# Patient Record
Sex: Female | Born: 1955 | Race: White | Hispanic: No | Marital: Married | Smoking: Former smoker
Health system: Southern US, Community
[De-identification: ages and names within clinical notes are randomized; demographics above are authoritative.]

## PROBLEM LIST (undated history)

## (undated) DIAGNOSIS — T4145XA Adverse effect of unspecified anesthetic, initial encounter: Secondary | ICD-10-CM

## (undated) DIAGNOSIS — H9190 Unspecified hearing loss, unspecified ear: Secondary | ICD-10-CM

## (undated) DIAGNOSIS — I10 Essential (primary) hypertension: Secondary | ICD-10-CM

## (undated) DIAGNOSIS — E669 Obesity, unspecified: Secondary | ICD-10-CM

## (undated) DIAGNOSIS — Z9889 Other specified postprocedural states: Secondary | ICD-10-CM

## (undated) DIAGNOSIS — M7918 Myalgia, other site: Secondary | ICD-10-CM

## (undated) DIAGNOSIS — Z8719 Personal history of other diseases of the digestive system: Secondary | ICD-10-CM

## (undated) DIAGNOSIS — E7800 Pure hypercholesterolemia, unspecified: Secondary | ICD-10-CM

## (undated) DIAGNOSIS — T8859XA Other complications of anesthesia, initial encounter: Secondary | ICD-10-CM

## (undated) DIAGNOSIS — R251 Tremor, unspecified: Secondary | ICD-10-CM

## (undated) DIAGNOSIS — K635 Polyp of colon: Secondary | ICD-10-CM

## (undated) DIAGNOSIS — K219 Gastro-esophageal reflux disease without esophagitis: Secondary | ICD-10-CM

## (undated) DIAGNOSIS — M26629 Arthralgia of temporomandibular joint, unspecified side: Secondary | ICD-10-CM

## (undated) DIAGNOSIS — R112 Nausea with vomiting, unspecified: Secondary | ICD-10-CM

## (undated) DIAGNOSIS — J189 Pneumonia, unspecified organism: Secondary | ICD-10-CM

## (undated) HISTORY — DX: Pure hypercholesterolemia, unspecified: E78.00

## (undated) HISTORY — PX: VAGINAL HYSTERECTOMY: SUR661

## (undated) HISTORY — PX: TONSILLECTOMY: SUR1361

## (undated) HISTORY — PX: TEMPOROMANDIBULAR JOINT SURGERY: SHX35

## (undated) HISTORY — DX: Other specified postprocedural states: Z98.890

## (undated) HISTORY — DX: Unspecified hearing loss, unspecified ear: H91.90

## (undated) HISTORY — DX: Polyp of colon: K63.5

## (undated) HISTORY — DX: Arthralgia of temporomandibular joint, unspecified side: M26.629

## (undated) HISTORY — PX: CHOLECYSTECTOMY: SHX55

## (undated) HISTORY — DX: Tremor, unspecified: R25.1

## (undated) HISTORY — DX: Obesity, unspecified: E66.9

## (undated) HISTORY — PX: BREAST SURGERY: SHX581

## (undated) HISTORY — DX: Essential (primary) hypertension: I10

## (undated) HISTORY — PX: VAGINAL PROLAPSE REPAIR: SHX830

## (undated) HISTORY — DX: Myalgia, other site: M79.18

---

## 2002-07-30 ENCOUNTER — Ambulatory Visit (HOSPITAL_COMMUNITY): Admission: RE | Admit: 2002-07-30 | Discharge: 2002-07-30 | Payer: Self-pay | Admitting: Orthopaedic Surgery

## 2002-07-30 ENCOUNTER — Encounter: Payer: Self-pay | Admitting: Orthopaedic Surgery

## 2003-10-01 DIAGNOSIS — Z9889 Other specified postprocedural states: Secondary | ICD-10-CM

## 2003-10-01 HISTORY — DX: Other specified postprocedural states: Z98.890

## 2006-10-02 ENCOUNTER — Ambulatory Visit: Payer: Self-pay | Admitting: General Practice

## 2009-06-07 ENCOUNTER — Ambulatory Visit (HOSPITAL_COMMUNITY): Admission: RE | Admit: 2009-06-07 | Discharge: 2009-06-07 | Payer: Self-pay | Admitting: Family Medicine

## 2010-01-12 ENCOUNTER — Ambulatory Visit (HOSPITAL_COMMUNITY)
Admission: RE | Admit: 2010-01-12 | Discharge: 2010-01-12 | Payer: Self-pay | Source: Home / Self Care | Admitting: Family Medicine

## 2011-02-15 LAB — COMPREHENSIVE METABOLIC PANEL
AST: 17 U/L
BUN/Creatinine Ratio: 15
Calcium: 9.8 mg/dL
Chloride, Serum: 105
Globulin, Total: 2.7
Total Protein: 6.9 g/dL

## 2011-02-15 LAB — CBC WITH DIFFERENTIAL/PLATELET
HCT: 41 %
platelet count: 354

## 2011-02-18 ENCOUNTER — Other Ambulatory Visit (HOSPITAL_COMMUNITY): Payer: Self-pay | Admitting: Internal Medicine

## 2011-02-18 DIAGNOSIS — N644 Mastodynia: Secondary | ICD-10-CM

## 2011-02-28 ENCOUNTER — Encounter: Payer: Self-pay | Admitting: Urgent Care

## 2011-02-28 ENCOUNTER — Ambulatory Visit (INDEPENDENT_AMBULATORY_CARE_PROVIDER_SITE_OTHER): Payer: 59 | Admitting: Urgent Care

## 2011-02-28 DIAGNOSIS — D126 Benign neoplasm of colon, unspecified: Secondary | ICD-10-CM

## 2011-02-28 DIAGNOSIS — Z800 Family history of malignant neoplasm of digestive organs: Secondary | ICD-10-CM

## 2011-02-28 DIAGNOSIS — K635 Polyp of colon: Secondary | ICD-10-CM

## 2011-02-28 DIAGNOSIS — R195 Other fecal abnormalities: Secondary | ICD-10-CM

## 2011-02-28 DIAGNOSIS — K219 Gastro-esophageal reflux disease without esophagitis: Secondary | ICD-10-CM

## 2011-02-28 MED ORDER — OMEPRAZOLE 20 MG PO CPDR: 20.0000 mg | DELAYED_RELEASE_CAPSULE | Freq: Every day | ORAL | Status: DC

## 2011-02-28 MED ORDER — SOD PICOSULFATE-MAG OX-CIT ACD 10-3.5-12 MG-GM-GM PO PACK: 1.0000 | PACK | Freq: Once | ORAL | Status: DC

## 2011-02-28 NOTE — Assessment & Plan Note (Signed)
Begin daily omeprazole 20 mg minutes before breakfast EGD

## 2011-02-28 NOTE — Assessment & Plan Note (Signed)
Brenda Love is a pleasant 55 y.o. female was recently told she had Hemoccult positive stool after her returning a card for her physical exam. She does have a personal history of colon polyps and a family history colon cancer in her mother.  Her symptoms include constipation and GERD, poorly controlled.  She is going to need both colonoscopy and EGD for further evaluation given her symptoms.  I have discussed risks & benefits which include, but are not limited to, bleeding, infection, perforation & drug reaction.  The patient agrees with this plan & written consent will be obtained.

## 2011-02-28 NOTE — Patient Instructions (Signed)
You need a colonoscopy and EGD with Dr. Darrick Penna Begin omeprazole 20 mg daily 30 minutes before breakfast.

## 2011-02-28 NOTE — Progress Notes (Signed)
Primary Care Physician:  Ignatius Specking., MD, MD Primary Gastroenterologist:  Dr. Darrick Penna  Chief Complaint  Patient presents with  . Colonoscopy    heme + stool, constipation    HPI:  Brenda Love is a 55 y.o. female here as a new patient for evaluation of heme positive stool on card for complete physical exam.  She also has a family history of colon cancer and a personal history of colon polyps. She complains of daily GERD taking OTC acid reducer she thinks it is generic Prilosec.  This medicine seems to help with her daily heartburn & indigestion.   Denies nausea, vomiting, dysphagia, odynophagia or anorexia.  C/o constipation.  Small hard balls.  Taking ketoprofen-she thinks this makes her constipation worse.  Taking daily over-the-counter stool softeners. Denies melena.  BM QD or QOD.  Wt stable.  App ok.    Past Medical History  Diagnosis Date  . Obesity   . Colon polyps     Dr. Linna Darner  . S/P colonoscopy 2007    Dr Hal Morales  . Hypertension   . Hypercholesteremia   . Hard of hearing   . TMJ syndrome     Past Surgical History  Procedure Date  . Vaginal hysterectomy   . Vaginal prolapse repair   . Cholecystectomy   . Tonsillectomy     Current Outpatient Prescriptions  Medication Sig Dispense Refill  . cyclobenzaprine (FLEXERIL) 10 MG tablet Take by mouth as needed.      Tery Sanfilippo Calcium (STOOL SOFTENER PO) Take 1 capsule by mouth daily.        Marland Kitchen ketoprofen (ORUDIS) 75 MG capsule Take 75 mg by mouth.      . metoprolol (LOPRESSOR) 50 MG tablet Take 50 mg by mouth.      Marland Kitchen omeprazole (PRILOSEC) 20 MG capsule Take 20 mg by mouth daily.        . Sod Picosulfate-Mag Ox-Cit Acd (PREPOPIK) 10-3.5-12 MG-GM-GM PACK Take 1 kit by mouth once.  1 each  0    Allergies as of 02/28/2011 - Review Complete 02/28/2011  Allergen Reaction Noted  . Codeine Itching and Nausea And Vomiting 02/28/2011    Family History  Problem Relation Age of Onset  . Colon cancer Mother 77  . Diabetes  Sister   . Coronary artery disease Father     History   Social History  . Marital Status: Married    Spouse Name: N/A    Number of Children: 3  . Years of Education: N/A   Occupational History  . [employer]    Social History Main Topics  . Smoking status: Former Smoker -- 1.0 packs/day for 5 years    Types: Cigarettes    Quit date: 02/27/1994  . Smokeless tobacco: Not on file  . Alcohol Use: No  . Drug Use: No  . Sexually Active: Not on file   Other Topics Concern  . Not on file   Social History Narrative   Lost 1 son-bad accidentOldest son in upper level apt  Review of Systems: Gen: Denies any fever, chills, sweats, anorexia, fatigue, weakness, malaise, weight loss, and sleep disorder CV: Denies chest pain, angina, palpitations, syncope, orthopnea, PND, peripheral edema, and claudication. Resp: Denies dyspnea at rest, dyspnea with exercise, cough, sputum, wheezing, coughing up blood, and pleurisy. GI: Denies vomiting blood, jaundice, and fecal incontinence.    GU : Denies urinary burning, blood in urine, urinary frequency, urinary hesitancy, nocturnal urination, and urinary incontinence. MS: Denies joint pain,  limitation of movement, and swelling, stiffness, low back pain, extremity pain. Denies muscle weakness, cramps, atrophy.  Derm: Denies rash, itching, dry skin, hives, moles, warts, or unhealing ulcers.  Psych: Denies depression, anxiety, memory loss, suicidal ideation, hallucinations, paranoia, and confusion. Heme: Denies bruising, bleeding, and enlarged lymph nodes. Neuro:  Denies any headaches, dizziness, paresthesias. Endo:  Denies any problems with DM, thyroid, adrenal function.  Physical Exam: BP 128/80  Pulse 71  Temp(Src) 97.7 F (36.5 C) (Temporal)  Ht 5' 2.5" (1.588 m)  Wt 223 lb (101.152 kg)  BMI 40.14 kg/m2 General:   Alert,  Well-developed, well-nourished, pleasant and cooperative in NAD.  Accompanied by her daughter today. Head:  Normocephalic  and atraumatic. Eyes:  Sclera clear, no icterus.   Conjunctiva pink. Ears:  Hard of hearing. Wears a hearing aid. Nose:  No deformity, discharge, or lesions. Mouth:  No deformity or lesions,oropharynx pink & moist. Neck:  Supple; no masses or thyromegaly. Lungs:  Clear throughout to auscultation.   No wheezes, crackles, or rhonchi. No acute distress. Heart:  Regular rate and rhythm; no murmurs, clicks, rubs,  or gallops. Abdomen:  Normal bowel sounds.  No bruits.  Soft, non-tender and non-distended without masses, hepatosplenomegaly or hernias noted.  No guarding or rebound tenderness.   Rectal:  Deferred. Msk:  Symmetrical without gross deformities.  Pulses:  Normal pulses noted. Extremities:  No clubbing or edema. Neurologic:  Alert and  oriented x4;  grossly normal neurologically. Skin:  Intact without significant lesions or rashes. Lymph Nodes:  No significant cervical adenopathy. Psych:  Alert and cooperative. Normal mood and affect.

## 2011-02-28 NOTE — Progress Notes (Signed)
Cc to PCP 

## 2011-03-01 ENCOUNTER — Encounter: Payer: Self-pay | Admitting: Urgent Care

## 2011-03-08 ENCOUNTER — Encounter (HOSPITAL_COMMUNITY): Payer: Self-pay | Admitting: Pharmacy Technician

## 2011-03-11 MED ORDER — SODIUM CHLORIDE 0.45 % IV SOLN
Freq: Once | INTRAVENOUS | Status: AC
  Administered 2011-03-13: 1000 mL via INTRAVENOUS

## 2011-03-13 ENCOUNTER — Encounter (HOSPITAL_COMMUNITY): Payer: Self-pay | Admitting: *Deleted

## 2011-03-13 ENCOUNTER — Other Ambulatory Visit: Payer: Self-pay | Admitting: Gastroenterology

## 2011-03-13 ENCOUNTER — Encounter (HOSPITAL_COMMUNITY)
Admission: RE | Disposition: A | Discharge status: Home / Self Care | Payer: Self-pay | Source: Ambulatory Visit | Attending: Gastroenterology

## 2011-03-13 ENCOUNTER — Ambulatory Visit (HOSPITAL_COMMUNITY)
Admission: RE | Admit: 2011-03-13 | Discharge: 2011-03-13 | Disposition: A | Discharge status: Home / Self Care | Payer: 59 | Source: Ambulatory Visit | Attending: Gastroenterology | Admitting: Gastroenterology

## 2011-03-13 DIAGNOSIS — K635 Polyp of colon: Secondary | ICD-10-CM

## 2011-03-13 DIAGNOSIS — I10 Essential (primary) hypertension: Secondary | ICD-10-CM | POA: Insufficient documentation

## 2011-03-13 DIAGNOSIS — Z860100 Personal history of colon polyps, unspecified: Secondary | ICD-10-CM | POA: Insufficient documentation

## 2011-03-13 DIAGNOSIS — K5730 Diverticulosis of large intestine without perforation or abscess without bleeding: Secondary | ICD-10-CM

## 2011-03-13 DIAGNOSIS — R195 Other fecal abnormalities: Secondary | ICD-10-CM

## 2011-03-13 DIAGNOSIS — D126 Benign neoplasm of colon, unspecified: Secondary | ICD-10-CM | POA: Insufficient documentation

## 2011-03-13 DIAGNOSIS — K219 Gastro-esophageal reflux disease without esophagitis: Secondary | ICD-10-CM

## 2011-03-13 DIAGNOSIS — K921 Melena: Secondary | ICD-10-CM | POA: Insufficient documentation

## 2011-03-13 DIAGNOSIS — Z6841 Body Mass Index (BMI) 40.0 and over, adult: Secondary | ICD-10-CM | POA: Insufficient documentation

## 2011-03-13 DIAGNOSIS — E785 Hyperlipidemia, unspecified: Secondary | ICD-10-CM | POA: Insufficient documentation

## 2011-03-13 DIAGNOSIS — K6389 Other specified diseases of intestine: Secondary | ICD-10-CM

## 2011-03-13 DIAGNOSIS — Z800 Family history of malignant neoplasm of digestive organs: Secondary | ICD-10-CM

## 2011-03-13 DIAGNOSIS — K2940 Chronic atrophic gastritis without bleeding: Secondary | ICD-10-CM | POA: Insufficient documentation

## 2011-03-13 DIAGNOSIS — K2960 Other gastritis without bleeding: Secondary | ICD-10-CM

## 2011-03-13 DIAGNOSIS — K319 Disease of stomach and duodenum, unspecified: Secondary | ICD-10-CM | POA: Insufficient documentation

## 2011-03-13 DIAGNOSIS — K648 Other hemorrhoids: Secondary | ICD-10-CM | POA: Insufficient documentation

## 2011-03-13 DIAGNOSIS — K5289 Other specified noninfective gastroenteritis and colitis: Secondary | ICD-10-CM | POA: Insufficient documentation

## 2011-03-13 HISTORY — PX: OTHER SURGICAL HISTORY: SHX169

## 2011-03-13 HISTORY — PX: ESOPHAGOGASTRODUODENOSCOPY: SHX1529

## 2011-03-13 SURGERY — COLONOSCOPY WITH ESOPHAGOGASTRODUODENOSCOPY (EGD)
Anesthesia: Moderate Sedation

## 2011-03-13 MED ORDER — STERILE WATER FOR IRRIGATION IR SOLN
Status: DC | PRN
  Administered 2011-03-13: 15:00:00

## 2011-03-13 MED ORDER — MIDAZOLAM HCL 5 MG/5ML IJ SOLN
INTRAMUSCULAR | Status: DC | PRN
  Administered 2011-03-13 (×5): 1 mg via INTRAVENOUS
  Administered 2011-03-13: 2 mg via INTRAVENOUS

## 2011-03-13 MED ORDER — MIDAZOLAM HCL 5 MG/5ML IJ SOLN
INTRAMUSCULAR | Status: AC
  Filled 2011-03-13: qty 10

## 2011-03-13 MED ORDER — PROMETHAZINE HCL 25 MG/ML IJ SOLN
INTRAMUSCULAR | Status: AC
  Filled 2011-03-13: qty 1

## 2011-03-13 MED ORDER — BUTAMBEN-TETRACAINE-BENZOCAINE 2-2-14 % EX AERO
INHALATION_SPRAY | CUTANEOUS | Status: DC | PRN
  Administered 2011-03-13: 1 via TOPICAL

## 2011-03-13 MED ORDER — MEPERIDINE HCL 100 MG/ML IJ SOLN
INTRAMUSCULAR | Status: DC | PRN
  Administered 2011-03-13 (×2): 25 mg via INTRAVENOUS
  Administered 2011-03-13: 50 mg via INTRAVENOUS

## 2011-03-13 MED ORDER — MEPERIDINE HCL 100 MG/ML IJ SOLN
INTRAMUSCULAR | Status: AC
  Filled 2011-03-13: qty 2

## 2011-03-13 NOTE — Op Note (Signed)
[facility] [address] [place], [place]  [address]  COLONOSCOPY PROCEDURE REPORT  PATIENT:  Brenda Love, Brenda Love  MR#:  [id number] BIRTHDATE:  1955/11/23, 56 yrs. old  GENDER:  female  ENDOSCOPIST:  Jonette Eva, MD REF. BY:  Doreen Beam, M.D. ASSISTANT:  PROCEDURE DATE:  03/13/2011 PROCEDURE:  ILEOColonoscopy with biopsy and snare polypectomy  INDICATIONS:  heme pos stools, personal Hx: polyps, mother had colon ca age 58  MEDICATIONS:   Demerol 100 mg IV, Versed 5 mg IV  DESCRIPTION OF PROCEDURE:    Physical exam was performed. Informed consent was obtained from the patient after explaining the benefits, risks, and alternatives to procedure.  The patient was connected to monitor and placed in left lateral position. Continuous oxygen was provided by nasal cannula and IV medicine administered through an indwelling cannula.  After administration of sedation and rectal exam, the patient's rectum was intubated and the [id number] ([id number]) colonoscope was advanced under direct visualization to the ILEUM.  The scope was removed slowly by carefully examining the color, texture, anatomy, and integrity mucosa on the way out.  The patient was recovered in endoscopy and discharged home in satisfactory condition. <<PROCEDUREIMAGES>>  FINDINGS:  RARE Diverticula were found in the sigmoid to descending colon segments.  MODERATE Internal Hemorrhoids were found. POLYPOID LESION REMOVED FROM HEPATIC FLEXURE AND SIGMOID COLON VIA COLD FORCEPS. LESION ON THE IC VALVE WHICH LOOKED LIKE PRIOR ULCER. IT WAS BIOPSIED VIA COLD FORCEPS.  PREP QUALITY: GOOD CECAL W/D TIME:    23 minutes  COMPLICATIONS:    None  ENDOSCOPIC IMPRESSION: 1) MILD Diverticulosis in the sigmoid to descending colon segments 2) Internal hemorrhoids 3) POLYPOID LESIONS IN COLON 4)IC VALVE LESION MOST LIKELY A HEALING ULCER FROM NSAID INJURY 5)HEME POS STOOLS 2o TO HEMORRHOIDS, NSAID  COLOPATHY/GASTRITIS  RECOMMENDATIONS: TCS IN 5 YEARS AWAIT BIOPSIES MINIMIZE NSAID USE HIGH FIBER DIET  REPEAT EXAM:  No  ______________________________ Jonette Eva, MD  CC:  Doreen Beam, M.D.  n. eSIGNED:   Armentha Branagan at 03/13/2011 03:14 PM  Genene Churn, [phone]

## 2011-03-13 NOTE — Op Note (Signed)
[facility] [address] [place], [place]  [address]  ENDOSCOPY PROCEDURE REPORT  PATIENT:  Love, Brenda  MR#:  [id number] BIRTHDATE:  May 23, 1955, 55 yrs. old  GENDER:  female  ENDOSCOPIST:  Jonette Eva, MD Referred by:  Doreen Beam, M.D.  PROCEDURE DATE:  03/13/2011 PROCEDURE:  EGD with biopsy, [address] ASA CLASS: INDICATIONS:  HEME POS STOOLS, PERSONAL HX: TMJ [id number] DYSPEPSIA, bmi > 40  MEDICATIONS:   TCS + Versed 2 mg IV TOPICAL ANESTHETIC:  Cetacaine Spray  DESCRIPTION OF PROCEDURE:     Physical exam was performed. Informed consent was obtained from the patient after explaining the benefits, risks, and alternatives to the procedure.  The patient was connected to the monitor and placed in the left lateral position.  Continuous oxygen was provided by nasal cannula and IV medicine administered through an indwelling cannula.  After administration of sedation, the patient's esophagus was intubated and the [id number] ([id number]) endoscope was advanced under direct visualization to the second portion of the duodenum.  The scope was removed slowly by carefully examining the color, texture, anatomy, and integrity of the mucosa on the way out.  The patient was recovered in endoscopy and discharged home in satisfactory condition. <<PROCEDUREIMAGES>>  Moderate gastritis was found in the antrum & BIOPSIED VIA COLD FORCEPS.  A 1-2 CM hiatal hernia was found. NL ESOPHAGUS AND DUODEUM.  COMPLICATIONS:    None  ENDOSCOPIC IMPRESSION: 1) Moderate gastritis in the antrum 2) SMALL Hiatal hernia  RECOMMENDATIONS: DAILY PPI FOREVER MINIMIZE KETOPROFEN USE OR CHANGE TO A DIFFRENT CLASS OF DRUG OPV IN 4 MOS. NEEDS HFP TO EVALUATE FOR NASH. AWAIT BIOPSIES LOSE 10-20 LBS.  REPEAT EXAM:  No  ______________________________ Jonette Eva, MD  CC:  n. eSIGNED:   Sandi Fields at 03/13/2011 03:07 PM  Genene Churn, [phone]

## 2011-03-13 NOTE — H&P (View-Only) (Signed)
Primary Care Physician:  VYAS,DHRUV B., MD, MD Primary Gastroenterologist:  Dr. Fields  Chief Complaint  Patient presents with  . Colonoscopy    heme + stool, constipation    HPI:  Brenda Love is a 55 y.o. female here as a new patient for evaluation of heme positive stool on card for complete physical exam.  She also has a family history of colon cancer and a personal history of colon polyps. She complains of daily GERD taking OTC acid reducer she thinks it is generic Prilosec.  This medicine seems to help with her daily heartburn & indigestion.   Denies nausea, vomiting, dysphagia, odynophagia or anorexia.  C/o constipation.  Small hard balls.  Taking ketoprofen-she thinks this makes her constipation worse.  Taking daily over-the-counter stool softeners. Denies melena.  BM QD or QOD.  Wt stable.  App ok.    Past Medical History  Diagnosis Date  . Obesity   . Colon polyps     Dr. Anwar  . S/P colonoscopy 2007    Dr Anwar-normal  . Hypertension   . Hypercholesteremia   . Hard of hearing   . TMJ syndrome     Past Surgical History  Procedure Date  . Vaginal hysterectomy   . Vaginal prolapse repair   . Cholecystectomy   . Tonsillectomy     Current Outpatient Prescriptions  Medication Sig Dispense Refill  . cyclobenzaprine (FLEXERIL) 10 MG tablet Take by mouth as needed.      . Docusate Calcium (STOOL SOFTENER PO) Take 1 capsule by mouth daily.        . ketoprofen (ORUDIS) 75 MG capsule Take 75 mg by mouth.      . metoprolol (LOPRESSOR) 50 MG tablet Take 50 mg by mouth.      . omeprazole (PRILOSEC) 20 MG capsule Take 20 mg by mouth daily.        . Sod Picosulfate-Mag Ox-Cit Acd (PREPOPIK) 10-3.5-12 MG-GM-GM PACK Take 1 kit by mouth once.  1 each  0    Allergies as of 02/28/2011 - Review Complete 02/28/2011  Allergen Reaction Noted  . Codeine Itching and Nausea And Vomiting 02/28/2011    Family History  Problem Relation Age of Onset  . Colon cancer Mother 62  . Diabetes  Sister   . Coronary artery disease Father     History   Social History  . Marital Status: Married    Spouse Name: N/A    Number of Children: 3  . Years of Education: N/A   Occupational History  . [employer]    Social History Main Topics  . Smoking status: Former Smoker -- 1.0 packs/day for 5 years    Types: Cigarettes    Quit date: 02/27/1994  . Smokeless tobacco: Not on file  . Alcohol Use: No  . Drug Use: No  . Sexually Active: Not on file   Other Topics Concern  . Not on file   Social History Narrative   Lost 1 son-bad accidentOldest son in upper level apt  Review of Systems: Gen: Denies any fever, chills, sweats, anorexia, fatigue, weakness, malaise, weight loss, and sleep disorder CV: Denies chest pain, angina, palpitations, syncope, orthopnea, PND, peripheral edema, and claudication. Resp: Denies dyspnea at rest, dyspnea with exercise, cough, sputum, wheezing, coughing up blood, and pleurisy. GI: Denies vomiting blood, jaundice, and fecal incontinence.    GU : Denies urinary burning, blood in urine, urinary frequency, urinary hesitancy, nocturnal urination, and urinary incontinence. MS: Denies joint pain,   limitation of movement, and swelling, stiffness, low back pain, extremity pain. Denies muscle weakness, cramps, atrophy.  Derm: Denies rash, itching, dry skin, hives, moles, warts, or unhealing ulcers.  Psych: Denies depression, anxiety, memory loss, suicidal ideation, hallucinations, paranoia, and confusion. Heme: Denies bruising, bleeding, and enlarged lymph nodes. Neuro:  Denies any headaches, dizziness, paresthesias. Endo:  Denies any problems with DM, thyroid, adrenal function.  Physical Exam: BP 128/80  Pulse 71  Temp(Src) 97.7 F (36.5 C) (Temporal)  Ht 5' 2.5" (1.588 m)  Wt 223 lb (101.152 kg)  BMI 40.14 kg/m2 General:   Alert,  Well-developed, well-nourished, pleasant and cooperative in NAD.  Accompanied by her daughter today. Head:  Normocephalic  and atraumatic. Eyes:  Sclera clear, no icterus.   Conjunctiva pink. Ears:  Hard of hearing. Wears a hearing aid. Nose:  No deformity, discharge, or lesions. Mouth:  No deformity or lesions,oropharynx pink & moist. Neck:  Supple; no masses or thyromegaly. Lungs:  Clear throughout to auscultation.   No wheezes, crackles, or rhonchi. No acute distress. Heart:  Regular rate and rhythm; no murmurs, clicks, rubs,  or gallops. Abdomen:  Normal bowel sounds.  No bruits.  Soft, non-tender and non-distended without masses, hepatosplenomegaly or hernias noted.  No guarding or rebound tenderness.   Rectal:  Deferred. Msk:  Symmetrical without gross deformities.  Pulses:  Normal pulses noted. Extremities:  No clubbing or edema. Neurologic:  Alert and  oriented x4;  grossly normal neurologically. Skin:  Intact without significant lesions or rashes. Lymph Nodes:  No significant cervical adenopathy. Psych:  Alert and cooperative. Normal mood and affect.   

## 2011-03-13 NOTE — Interval H&P Note (Signed)
History and Physical Interval Note:  03/13/2011 12:25 PM  Brenda Love  has presented today for surgery, with the diagnosis of hx of colon polyps, heme positive stools/ GERD  The various methods of treatment have been discussed with the patient and family. After consideration of risks, benefits and other options for treatment, the patient has consented to  Procedure(s): COLONOSCOPY WITH ESOPHAGOGASTRODUODENOSCOPY (EGD) as a surgical intervention .  The patients' history has been reviewed, patient examined, no change in status, stable for surgery.  I have reviewed the patients' chart and labs.  Questions were answered to the patient's satisfaction.     [employer]

## 2011-03-14 ENCOUNTER — Telehealth: Payer: Self-pay | Admitting: Gastroenterology

## 2011-03-14 ENCOUNTER — Encounter: Payer: Self-pay | Admitting: Gastroenterology

## 2011-03-14 NOTE — Telephone Encounter (Signed)
Per SLF, it is ok to give note that pt can go back to work on 03/17/10. Letter written and given to pt.

## 2011-03-14 NOTE — Telephone Encounter (Signed)
Patient wants to come by an pick up a work note to say its ok to return back to work Sunday Jan 6th since her procedure 03/13/11 by SLF

## 2011-03-15 NOTE — Telephone Encounter (Signed)
REVIEWED.  

## 2011-03-19 ENCOUNTER — Telehealth: Payer: Self-pay | Admitting: Gastroenterology

## 2011-03-19 NOTE — Telephone Encounter (Signed)
LM for pt to call

## 2011-03-19 NOTE — Telephone Encounter (Signed)
Please call pt. HER stomach Bx shows mild gastritis DUE TO KETOPROFEN. Continue OMP DAILY. The biopsy of the colon showed an ulcer that came from the ketoprofen.  She had simple adenomas removed from her colon. TCS in 5 years. FOLLOW A High fiber diet. AVOID NSAIDs. OPV IN 4 MOS.

## 2011-03-19 NOTE — Progress Notes (Signed)
REVIEWED.  

## 2011-03-19 NOTE — Telephone Encounter (Signed)
Results Cc to PCP  

## 2011-03-20 ENCOUNTER — Telehealth: Payer: Self-pay

## 2011-03-20 ENCOUNTER — Encounter (HOSPITAL_COMMUNITY): Payer: Self-pay

## 2011-03-20 NOTE — Telephone Encounter (Signed)
This daughter, Arlee Muslim, also said Mom had texted her and asked her to call office for results. Informed her also. She said to call her at [phone] to schedule appts.

## 2011-03-20 NOTE — Telephone Encounter (Signed)
Pt's daughter, Jennika Ringgold, called to get results for pt since she does not hear over the phone. Gave her the info on the results of procedure.

## 2011-03-20 NOTE — Telephone Encounter (Signed)
Pt is aware of OV on 5/2 at 1030 with LSL and reminder in epic to have tcs in 5 yrs. Pt's contact number was changed to her daughter, Victorino Dike. [phone] is primary number

## 2011-03-20 NOTE — Telephone Encounter (Signed)
Pt's daughter, Gretchen Weinfeld, called for her results since pt cannot hear well. Informed of all of the info on procedure and recommendations.

## 2011-03-20 NOTE — Telephone Encounter (Signed)
Pt is aware of OV on 5/2 at 1030 with LSL and reminder in epic to have tcs in 5 yrs. Pt's primary number was changed in epic (daughter's number)

## 2011-07-10 ENCOUNTER — Encounter: Payer: Self-pay | Admitting: Gastroenterology

## 2011-07-11 ENCOUNTER — Telehealth: Payer: Self-pay | Admitting: Gastroenterology

## 2011-07-11 ENCOUNTER — Ambulatory Visit: Payer: 59 | Admitting: Gastroenterology

## 2011-07-11 NOTE — Telephone Encounter (Signed)
Pt was a no show

## 2011-09-06 ENCOUNTER — Other Ambulatory Visit: Payer: Self-pay | Admitting: Urgent Care

## 2012-01-17 ENCOUNTER — Other Ambulatory Visit (HOSPITAL_COMMUNITY): Payer: Self-pay | Admitting: Orthopaedic Surgery

## 2012-01-17 DIAGNOSIS — M25561 Pain in right knee: Secondary | ICD-10-CM

## 2012-01-22 ENCOUNTER — Ambulatory Visit (HOSPITAL_COMMUNITY)
Admission: RE | Admit: 2012-01-22 | Discharge: 2012-01-22 | Disposition: A | Discharge status: Home / Self Care | Payer: 59 | Source: Ambulatory Visit | Attending: Orthopaedic Surgery | Admitting: Orthopaedic Surgery

## 2012-01-22 DIAGNOSIS — R937 Abnormal findings on diagnostic imaging of other parts of musculoskeletal system: Secondary | ICD-10-CM | POA: Insufficient documentation

## 2012-01-22 DIAGNOSIS — M2240 Chondromalacia patellae, unspecified knee: Secondary | ICD-10-CM | POA: Insufficient documentation

## 2012-01-22 DIAGNOSIS — M25569 Pain in unspecified knee: Secondary | ICD-10-CM | POA: Insufficient documentation

## 2012-01-22 DIAGNOSIS — M25561 Pain in right knee: Secondary | ICD-10-CM

## 2012-04-18 ENCOUNTER — Other Ambulatory Visit: Payer: Self-pay | Admitting: Urgent Care

## 2012-10-07 ENCOUNTER — Other Ambulatory Visit (HOSPITAL_COMMUNITY): Payer: Self-pay | Admitting: *Deleted

## 2012-10-07 DIAGNOSIS — S0300XA Dislocation of jaw, unspecified side, initial encounter: Secondary | ICD-10-CM

## 2012-10-08 ENCOUNTER — Ambulatory Visit (HOSPITAL_COMMUNITY): Payer: 59

## 2012-10-15 ENCOUNTER — Ambulatory Visit (HOSPITAL_COMMUNITY)
Admission: RE | Admit: 2012-10-15 | Discharge: 2012-10-15 | Disposition: A | Discharge status: Home / Self Care | Payer: 59 | Source: Ambulatory Visit | Attending: Internal Medicine | Admitting: Internal Medicine

## 2012-10-15 DIAGNOSIS — X58XXXA Exposure to other specified factors, initial encounter: Secondary | ICD-10-CM | POA: Insufficient documentation

## 2012-10-15 DIAGNOSIS — S0300XA Dislocation of jaw, unspecified side, initial encounter: Secondary | ICD-10-CM | POA: Insufficient documentation

## 2012-11-12 ENCOUNTER — Other Ambulatory Visit: Payer: Self-pay | Admitting: Gastroenterology

## 2012-11-15 ENCOUNTER — Other Ambulatory Visit: Payer: Self-pay | Admitting: Gastroenterology

## 2012-12-24 ENCOUNTER — Encounter (HOSPITAL_COMMUNITY)
Admission: RE | Admit: 2012-12-24 | Discharge: 2012-12-24 | Disposition: A | Discharge status: Home / Self Care | Payer: 59 | Source: Ambulatory Visit | Attending: Anesthesiology | Admitting: Anesthesiology

## 2012-12-24 ENCOUNTER — Encounter (HOSPITAL_COMMUNITY)
Admission: RE | Admit: 2012-12-24 | Discharge: 2012-12-24 | Disposition: A | Discharge status: Home / Self Care | Payer: 59 | Source: Ambulatory Visit | Attending: Oral Surgery | Admitting: Oral Surgery

## 2012-12-24 ENCOUNTER — Encounter (HOSPITAL_COMMUNITY): Payer: Self-pay

## 2012-12-24 DIAGNOSIS — Z01818 Encounter for other preprocedural examination: Secondary | ICD-10-CM | POA: Insufficient documentation

## 2012-12-24 DIAGNOSIS — Z01812 Encounter for preprocedural laboratory examination: Secondary | ICD-10-CM | POA: Insufficient documentation

## 2012-12-24 DIAGNOSIS — Z01810 Encounter for preprocedural cardiovascular examination: Secondary | ICD-10-CM | POA: Insufficient documentation

## 2012-12-24 HISTORY — DX: Pneumonia, unspecified organism: J18.9

## 2012-12-24 HISTORY — DX: Adverse effect of unspecified anesthetic, initial encounter: T41.45XA

## 2012-12-24 HISTORY — DX: Personal history of other diseases of the digestive system: Z87.19

## 2012-12-24 HISTORY — DX: Other specified postprocedural states: Z98.890

## 2012-12-24 HISTORY — DX: Other complications of anesthesia, initial encounter: T88.59XA

## 2012-12-24 HISTORY — DX: Gastro-esophageal reflux disease without esophagitis: K21.9

## 2012-12-24 HISTORY — DX: Other specified postprocedural states: R11.2

## 2012-12-24 LAB — BASIC METABOLIC PANEL
BUN: 7 mg/dL (ref 6–23)
Creatinine, Ser: 0.56 mg/dL (ref 0.50–1.10)
GFR calc Af Amer: >90 mL/min (ref >90)
GFR calc non Af Amer: >90 mL/min (ref >90)
Potassium: 4.7 mEq/L (ref 3.5–5.1)

## 2012-12-24 LAB — CBC
Hemoglobin: 13.5 g/dL (ref 12.0–15.0)
MCHC: 32.6 g/dL (ref 30.0–36.0)
RDW: 13.2 % (ref 11.5–15.5)

## 2012-12-24 NOTE — Progress Notes (Signed)
Pt denies SOB, chest pain, and being under the care of a cardiologist. Pt denies having any cardiac studies as well as  a chest x ray and EKG within the last year.

## 2012-12-24 NOTE — H&P (Signed)
HISTORY AND PHYSICAL  Brenda Love is a 57 y.o. female patient with CC: right TMJ painful clicking and popping.   HPI: 2 year h/o bilateral TMJ pain, right worse than left. Not relieved by NSAIDs, Soft diet, ice, heat, TMJ splint. Pt underwent Bilateral TMJ arthrocentesis with botox injections on 07/30/2012. Had some relief but  Pain returned. 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  No diagnosis found.  Past Medical History  Diagnosis Date  . Obesity   . Colon polyps     Dr. Linna Darner  . S/P colonoscopy 10/01/2003    Dr Hal Morales  . Hypertension   . Hypercholesteremia   . Hard of hearing   . TMJ syndrome   . Complication of anesthesia     woke up during procedure  . PONV (postoperative nausea and vomiting)   . Pneumonia   . GERD (gastroesophageal reflux disease)   . H/O hiatal hernia     No current facility-administered medications for this encounter.   Current Outpatient Prescriptions  Medication Sig Dispense Refill  . cyclobenzaprine (FLEXERIL) 10 MG tablet Take 10 mg by mouth at bedtime as needed for muscle spasms.       Marland Kitchen docusate sodium (COLACE) 100 MG capsule Take 100 mg by mouth every morning.        . metoprolol (LOPRESSOR) 50 MG tablet Take 25-50 mg by mouth 2 (two) times daily. Takes 1 tablet in am and 1/2 tablet in pm      . Multiple Vitamin (MULITIVITAMIN WITH MINERALS) TABS Take 1 tablet by mouth every morning.        Marland Kitchen omeprazole (PRILOSEC) 20 MG capsule Take 20 mg by mouth daily.      . traMADol (ULTRAM) 50 MG tablet Take 50 mg by mouth every 6 (six) hours as needed for pain.       Allergies  Allergen Reactions  . Codeine Hives and Nausea And Vomiting  . Sulfa Antibiotics Nausea And Vomiting   Active Problems:   * No active hospital problems. *  Vitals: There were no vitals taken for this visit. Lab results: Results for orders placed during the hospital encounter of 12/24/12 (from the past 24 hour(s))   CBC     Status: None   Collection Time    12/24/12 10:20 AM      Result Value Range   WBC 5.1  4.0 - 10.5 K/uL   RBC 4.51  3.87 - 5.11 MIL/uL   Hemoglobin 13.5  12.0 - 15.0 g/dL   HCT 16.1  09.6 - 04.5 %   MCV 91.8  78.0 - 100.0 fL   MCH 29.9  26.0 - 34.0 pg   MCHC 32.6  30.0 - 36.0 g/dL   RDW 40.9  81.1 - 91.4 %   Platelets 374  150 - 400 K/uL   Radiology Results: Dg Chest 2 View  12/24/2012   *RADIOLOGY REPORT*  Clinical Data: Preoperative examination, history hypertension, initial encounter.  CHEST - 2 VIEW  Comparison: None  Findings:  Grossly unchanged cardiac silhouette and mediastinal contours. Improved aeration of the bilateral lung bases.  Minimal residual left basilar linear opacities favored to represent atelectasis or scar.  No new focal airspace opacity.  No pleural effusion or pneumothorax.  No evidence of edema.  Unchanged bones.  Post cholecystectomy.  IMPRESSION:  Minimal left basilar atelectasis without acute cardiopulmonary diease.   Original Report Authenticated By: Tacey Ruiz, MD   General appearance:  alert, cooperative, no distress and moderately obese Head: Normocephalic, without obvious abnormality, atraumatic Eyes: negative Nose: Nares normal. Septum midline. Mucosa normal. No drainage or sinus tenderness. Throat: lips, mucosa, and tongue normal; teeth and gums normal and bilateral clicking and popping of TMJ. Maximum opening  40mm. Patient wears upper and lower partial dentures. pharynx clear. Neck: no adenopathy, supple, symmetrical, trachea midline and thyroid not enlarged, symmetric, no tenderness/mass/nodules Resp: clear to auscultation bilaterally Cardio: regular rate and rhythm, S1, S2 normal, no murmur, click, rub or gallop  Assessment: 56 WF Obese, HTN, GERD, Hiatal hernia, hard of hearing with right TMJ disc dislocation.  Plan: Right TMJ arthrotomy with possible meniscectomy or partial meniscectomy. General anesthesia. Day surgery or 23 hour  observation.   Caran Storck M 12/24/2012

## 2012-12-24 NOTE — Pre-Procedure Instructions (Signed)
Brenda Love  12/24/2012   Your procedure is scheduled on: Monday, December 28, 2012  Report to [facility] Short Stay (use Main Entrance "A'') at 5:30 AM.  Call this number if you have problems the morning of surgery: [phone]   Remember:   Do not eat food or drink liquids after midnight.   Take these medicines the morning of surgery with A SIP OF WATER: metoprolol (LOPRESSOR)   omeprazole (PRILOSEC) 20 MG capsule if needed: traMADol (ULTRAM) 50 MG tablet for pain   Stop taking Aspirin, Vitamins, and herbal medications. Do not take any NSAIDs ie: Ibuprofen,  Advil,  Naproxen or any medication containing Aspirin.   Do not wear jewelry, make-up or nail polish.  Do not wear lotions, powders, or perfumes. You may wear deodorant.  Do not shave 48 hours prior to surgery.  Do not bring valuables to the hospital.  [facility] is not responsible for any belongings or  valuables.               Contacts, dentures or bridgework may not be worn into surgery.  Leave suitcase in the car. After surgery it may be brought to your room.  For patients admitted to the hospital, discharge time is determined by your treatment team.               Patients discharged the day of surgery will not be allowed to drive home.  Name and phone number of your driver:   Special Instructions: Shower using CHG 2 nights before surgery and the night before surgery.  If you shower the day of surgery use CHG.  Use special wash - you have one bottle of CHG for all showers.  You should use approximately 1/3 of the bottle for each shower.   Please read over the following fact sheets that you were given: Pain Booklet, Coughing and Deep Breathing and Surgical Site Infection Prevention

## 2012-12-24 NOTE — Progress Notes (Signed)
Anesthesia chart review:  Patient is a 57 year old female scheduled for right temporomandibular joint arthrotomy, possible meniscectomy on 12/28/12 by Dr. Barbette Merino.  History included obesity, hypercholesterolemia, former smoker, post-operative N/V, HTN, hiatal hernia, GERD, hard of hearing, hysterectomy, tonsillectomy, cholecystectomy, left lumpectomy. She reported that she woke up during a prior procedure--details unknown. PCP is listed as Dr. Doreen Beam.  EKG on 12/24/12 showed NSR.  CXR on 12/24/12 showed minimal left basilar atelectasis without acute cardiopulmonary diease.  Preoperative labs noted.  Anticipate that she can proceed as planned.  Velna Ochs [facility]/Anesthesiology Phone [phone] 12/24/2012 1:25 PM

## 2012-12-27 MED ORDER — CEFAZOLIN SODIUM-DEXTROSE 2-3 GM-% IV SOLR
2.0000 g | INTRAVENOUS | Status: AC
  Administered 2012-12-28: 2 g via INTRAVENOUS
  Filled 2012-12-27: qty 50

## 2012-12-28 ENCOUNTER — Ambulatory Visit (HOSPITAL_COMMUNITY): Payer: 59 | Admitting: Anesthesiology

## 2012-12-28 ENCOUNTER — Encounter (HOSPITAL_COMMUNITY)
Admission: RE | Disposition: A | Discharge status: Home / Self Care | Payer: Self-pay | Source: Ambulatory Visit | Attending: Oral Surgery

## 2012-12-28 ENCOUNTER — Encounter (HOSPITAL_COMMUNITY): Payer: Self-pay | Admitting: *Deleted

## 2012-12-28 ENCOUNTER — Encounter (HOSPITAL_COMMUNITY): Payer: 59 | Admitting: Vascular Surgery

## 2012-12-28 ENCOUNTER — Ambulatory Visit (HOSPITAL_COMMUNITY)
Admission: RE | Admit: 2012-12-28 | Discharge: 2012-12-28 | Disposition: A | Discharge status: Home / Self Care | Payer: 59 | Source: Ambulatory Visit | Attending: Oral Surgery | Admitting: Oral Surgery

## 2012-12-28 DIAGNOSIS — I10 Essential (primary) hypertension: Secondary | ICD-10-CM | POA: Insufficient documentation

## 2012-12-28 DIAGNOSIS — K219 Gastro-esophageal reflux disease without esophagitis: Secondary | ICD-10-CM

## 2012-12-28 DIAGNOSIS — M2669 Other specified disorders of temporomandibular joint: Secondary | ICD-10-CM

## 2012-12-28 DIAGNOSIS — K635 Polyp of colon: Secondary | ICD-10-CM

## 2012-12-28 DIAGNOSIS — Z800 Family history of malignant neoplasm of digestive organs: Secondary | ICD-10-CM

## 2012-12-28 DIAGNOSIS — R195 Other fecal abnormalities: Secondary | ICD-10-CM

## 2012-12-28 DIAGNOSIS — R6884 Jaw pain: Secondary | ICD-10-CM

## 2012-12-28 HISTORY — PX: ARTHROTOMY: SHX5728

## 2012-12-28 SURGERY — ARTHROTOMY
Anesthesia: General | Site: Face | Laterality: Right | Wound class: Clean

## 2012-12-28 MED ORDER — LIDOCAINE-EPINEPHRINE 2 %-1:100000 IJ SOLN
INTRAMUSCULAR | Status: DC | PRN
  Administered 2012-12-28: 10 mL

## 2012-12-28 MED ORDER — OXYCODONE-ACETAMINOPHEN 5-325 MG PO TABS
ORAL_TABLET | ORAL | Status: AC
  Filled 2012-12-28: qty 1

## 2012-12-28 MED ORDER — PROPOFOL 10 MG/ML IV BOLUS
INTRAVENOUS | Status: DC | PRN
  Administered 2012-12-28: 200 mg via INTRAVENOUS

## 2012-12-28 MED ORDER — OXYCODONE HCL 5 MG PO TABS
5.0000 mg | ORAL_TABLET | ORAL | Status: DC | PRN
  Administered 2012-12-28: 5 mg via ORAL

## 2012-12-28 MED ORDER — BACITRACIN ZINC 500 UNIT/GM EX OINT
TOPICAL_OINTMENT | CUTANEOUS | Status: DC | PRN
  Administered 2012-12-28: 1 via TOPICAL

## 2012-12-28 MED ORDER — OXYCODONE HCL 5 MG PO TABS
ORAL_TABLET | ORAL | Status: AC
  Filled 2012-12-28: qty 1

## 2012-12-28 MED ORDER — SODIUM CHLORIDE 0.9 % IR SOLN
Status: DC | PRN
  Administered 2012-12-28: 1

## 2012-12-28 MED ORDER — ONDANSETRON HCL 4 MG/2ML IJ SOLN
INTRAMUSCULAR | Status: DC | PRN
  Administered 2012-12-28 (×2): 4 mg via INTRAMUSCULAR

## 2012-12-28 MED ORDER — NEOSTIGMINE METHYLSULFATE 1 MG/ML IJ SOLN
INTRAMUSCULAR | Status: DC | PRN
  Administered 2012-12-28: 2 mg via INTRAVENOUS

## 2012-12-28 MED ORDER — ROCURONIUM BROMIDE 100 MG/10ML IV SOLN
INTRAVENOUS | Status: DC | PRN
  Administered 2012-12-28: 40 mg via INTRAVENOUS

## 2012-12-28 MED ORDER — LIDOCAINE HCL (CARDIAC) 20 MG/ML IV SOLN
INTRAVENOUS | Status: DC | PRN
  Administered 2012-12-28: 100 mg via INTRAVENOUS

## 2012-12-28 MED ORDER — MIDAZOLAM HCL 5 MG/5ML IJ SOLN
INTRAMUSCULAR | Status: DC | PRN
  Administered 2012-12-28: 2 mg via INTRAVENOUS

## 2012-12-28 MED ORDER — LIDOCAINE-EPINEPHRINE 2 %-1:100000 IJ SOLN
INTRAMUSCULAR | Status: AC
  Filled 2012-12-28: qty 1

## 2012-12-28 MED ORDER — LACTATED RINGERS IV SOLN
INTRAVENOUS | Status: DC | PRN
  Administered 2012-12-28 (×2): via INTRAVENOUS

## 2012-12-28 MED ORDER — OXYCODONE-ACETAMINOPHEN 5-325 MG PO TABS
1.0000 | ORAL_TABLET | ORAL | Status: DC | PRN
  Administered 2012-12-28: 1 via ORAL

## 2012-12-28 MED ORDER — HYDROMORPHONE HCL PF 1 MG/ML IJ SOLN: 0.2500 mg | INTRAMUSCULAR | Status: DC | PRN

## 2012-12-28 MED ORDER — GLYCOPYRROLATE 0.2 MG/ML IJ SOLN
INTRAMUSCULAR | Status: DC | PRN
  Administered 2012-12-28: 0.4 mg via INTRAVENOUS

## 2012-12-28 MED ORDER — OXYCODONE-ACETAMINOPHEN 10-325 MG PO TABS: 1.0000 | ORAL_TABLET | ORAL | Status: DC | PRN

## 2012-12-28 MED ORDER — PROMETHAZINE HCL 25 MG/ML IJ SOLN: 6.2500 mg | INTRAMUSCULAR | Status: DC | PRN

## 2012-12-28 MED ORDER — ONDANSETRON HCL 4 MG PO TABS: 4.0000 mg | ORAL_TABLET | Freq: Three times a day (TID) | ORAL | Status: DC | PRN

## 2012-12-28 MED ORDER — OXYMETAZOLINE HCL 0.05 % NA SOLN
NASAL | Status: DC | PRN
  Administered 2012-12-28: 2 via NASAL

## 2012-12-28 MED ORDER — OXYMETAZOLINE HCL 0.05 % NA SOLN
NASAL | Status: AC
  Filled 2012-12-28: qty 15

## 2012-12-28 MED ORDER — FENTANYL CITRATE 0.05 MG/ML IJ SOLN
INTRAMUSCULAR | Status: DC | PRN
  Administered 2012-12-28 (×2): 100 ug via INTRAVENOUS

## 2012-12-28 MED ORDER — BACITRACIN ZINC 500 UNIT/GM EX OINT
TOPICAL_OINTMENT | CUTANEOUS | Status: AC
  Filled 2012-12-28: qty 15

## 2012-12-28 SURGICAL SUPPLY — 74 items
ATTRACTOMAT 16X20 MAGNETIC DRP (DRAPES) ×1 IMPLANT
BANDAGE ELASTIC 4 VELCRO ST LF (GAUZE/BANDAGES/DRESSINGS) ×2 IMPLANT
BANDAGE GAUZE ELAST BULKY 4 IN (GAUZE/BANDAGES/DRESSINGS) ×2 IMPLANT
BLADE MINI 60D BLUE (BLADE) ×1 IMPLANT
BLADE RECIP (BLADE) IMPLANT
BLADE RECIPRO TAPERED (BLADE) IMPLANT
BLADE SCLERAL 3.0 60 BEV DOWN (BLADE) ×1 IMPLANT
BLADE SURG 15 STRL LF DISP TIS (BLADE) IMPLANT
BLADE SURG 15 STRL SS (BLADE)
BLADE SURG CLIPPER 3M 9600 (MISCELLANEOUS) ×2 IMPLANT
BUR CROSS CUT (BURR)
BUR CROSS CUT FISSURE 1.6 (BURR) IMPLANT
BUR EGG ELITE 4.0 (BURR) IMPLANT
BUR SABER DIAMOND 2.5 (BURR) IMPLANT
BUR SRG MED 1.6XXCUT FSSR (BURR) IMPLANT
BUR SURG 4X8 MED (BURR) IMPLANT
BURR SRG MED 1.6XXCUT FSSR (BURR)
BURR SURG 4X8 MED (BURR)
CANISTER SUCTION 2500CC (MISCELLANEOUS) ×2 IMPLANT
CLEANER TIP ELECTROSURG 2X2 (MISCELLANEOUS) ×2 IMPLANT
CLOTH BEACON ORANGE TIMEOUT ST (SAFETY) ×1 IMPLANT
COVER SURGICAL LIGHT HANDLE (MISCELLANEOUS) ×2 IMPLANT
CRADLE DONUT ADULT HEAD (MISCELLANEOUS) ×2 IMPLANT
DRAPE SURG 17X23 STRL (DRAPES) ×4 IMPLANT
DRESSING TELFA 8X3 (GAUZE/BANDAGES/DRESSINGS) ×2 IMPLANT
ELECT COATED BLADE 2.86 ST (ELECTRODE) ×2 IMPLANT
ELECT NDL TIP 2.8 STRL (NEEDLE) IMPLANT
ELECT NEEDLE TIP 2.8 STRL (NEEDLE) IMPLANT
ELECT REM PT RETURN 9FT ADLT (ELECTROSURGICAL) ×2
ELECTRODE REM PT RTRN 9FT ADLT (ELECTROSURGICAL) IMPLANT
GAUZE SPONGE 4X4 16PLY XRAY LF (GAUZE/BANDAGES/DRESSINGS) IMPLANT
GLOVE BIO SURGEON STRL SZ 6.5 (GLOVE) ×2 IMPLANT
GLOVE BIO SURGEON STRL SZ7.5 (GLOVE) ×2 IMPLANT
GLOVE BIOGEL PI IND STRL 6.5 (GLOVE) IMPLANT
GLOVE BIOGEL PI IND STRL 7.0 (GLOVE) ×1 IMPLANT
GLOVE BIOGEL PI INDICATOR 6.5 (GLOVE) ×1
GLOVE BIOGEL PI INDICATOR 7.0 (GLOVE) ×1
GLOVE SURG SS PI 6.5 STRL IVOR (GLOVE) ×2 IMPLANT
GOWN STRL NON-REIN LRG LVL3 (GOWN DISPOSABLE) ×4 IMPLANT
GOWN STRL REIN XL XLG (GOWN DISPOSABLE) ×2 IMPLANT
KIT BASIN OR (CUSTOM PROCEDURE TRAY) ×2 IMPLANT
KIT ROOM TURNOVER OR (KITS) ×2 IMPLANT
LOCATOR NERVE 3 VOLT (DISPOSABLE) IMPLANT
NDL BLUNT 16X1.5 OR ONLY (NEEDLE) IMPLANT
NEEDLE 22X1 1/2 (OR ONLY) (NEEDLE) ×3 IMPLANT
NEEDLE BLUNT 16X1.5 OR ONLY (NEEDLE) IMPLANT
NS IRRIG 1000ML POUR BTL (IV SOLUTION) ×2 IMPLANT
PAD ARMBOARD 7.5X6 YLW CONV (MISCELLANEOUS) ×4 IMPLANT
PENCIL BUTTON HOLSTER BLD 10FT (ELECTRODE) ×2 IMPLANT
RASP HELIOCORDIAL MED (MISCELLANEOUS) IMPLANT
SOL PREP POV-IOD 4OZ 10% (MISCELLANEOUS) ×1 IMPLANT
SPONGE GAUZE 4X4 12PLY (GAUZE/BANDAGES/DRESSINGS) ×2 IMPLANT
SUT CHROMIC 3 0 PS 2 (SUTURE) IMPLANT
SUT CHROMIC 4 0 P 3 18 (SUTURE) IMPLANT
SUT ETHILON 5 0 P 3 18 (SUTURE)
SUT MERSILENE 4 0 P 3 (SUTURE) IMPLANT
SUT NYLON ETHILON 5-0 P-3 1X18 (SUTURE) IMPLANT
SUT PROLENE 5 0 P 3 (SUTURE) ×2 IMPLANT
SUT PROLENE 5 0 PS 2 (SUTURE) IMPLANT
SUT SILK 2 0 (SUTURE) ×2
SUT SILK 2-0 18XBRD TIE 12 (SUTURE) IMPLANT
SUT SILK 3 0 (SUTURE)
SUT SILK 3-0 18XBRD TIE 12 (SUTURE) IMPLANT
SUT VIC AB 4-0 PC1 18 (SUTURE) ×2 IMPLANT
SUT VIC AB 4-0 PS2 27 (SUTURE) IMPLANT
SYR 50ML SLIP (SYRINGE) IMPLANT
SYR CONTROL 10ML LL (SYRINGE) ×2 IMPLANT
TAPE HY-TAPE 1X5Y PINK NS LF (GAUZE/BANDAGES/DRESSINGS) IMPLANT
TOWEL OR 17X24 6PK STRL BLUE (TOWEL DISPOSABLE) ×2 IMPLANT
TOWEL OR 17X26 10 PK STRL BLUE (TOWEL DISPOSABLE) ×2 IMPLANT
TRAY ENT MC OR (CUSTOM PROCEDURE TRAY) ×2 IMPLANT
TUBE CONNECTING 12X1/4 (SUCTIONS) ×2 IMPLANT
WATER STERILE IRR 1000ML POUR (IV SOLUTION) ×1 IMPLANT
YANKAUER SUCT BULB TIP NO VENT (SUCTIONS) ×2 IMPLANT

## 2012-12-28 NOTE — Anesthesia Postprocedure Evaluation (Signed)
Anesthesia Post Note  Patient: Brenda Love  Procedure(s) Performed: Procedure(s) (LRB): Temporomandibular Joint Arthrotomy; Meniscectomy (Right)  Anesthesia type: general  Patient location: PACU  Post pain: Pain level controlled  Post assessment: Patient's Cardiovascular Status Stable  Post vital signs: Reviewed and stable  Level of consciousness: sedated  Complications: No apparent anesthesia complications

## 2012-12-28 NOTE — OR Nursing (Signed)
Two sprays of oxymetazoline hydrochloride 0.05% applied to each nostril in the preoperative area by Whitman Hero, CRNA.  Oralia Manis, RN

## 2012-12-28 NOTE — Op Note (Signed)
12/28/2012  9:00 AM  PATIENT:  Brenda Love  57 y.o. female  PRE-OPERATIVE DIAGNOSIS:  ANTERIOR DISLOCATION OF THE MENISCUS OF THE RIGHT TEMPOROMANDIBULAR JOINT  POST-OPERATIVE DIAGNOSIS:  SAME  PROCEDURE:  Procedure(s): Right Temporomandibular Joint Arthrotomy; Meniscectomy  SURGEON:  Surgeon(s): Georgia Lopes, DDS  ANESTHESIA:   local and general  EBL:  minimal  DRAINS: none   SPECIMEN:  Right TMJ disc  COUNTS:  YES  PLAN OF CARE: Discharge to home after PACU  PATIENT DISPOSITION:  PACU - hemodynamically stable.   PROCEDURE DETAILS: Dictation # [id number]  Georgia Lopes, DMD 12/28/2012 9:00 AM

## 2012-12-28 NOTE — Anesthesia Preprocedure Evaluation (Signed)
Anesthesia Evaluation  Patient identified by MRN, date of birth, ID band Patient awake    Reviewed: Allergy & Precautions, H&P , Patient's Chart, lab work & pertinent test results, reviewed documented beta blocker date and time   History of Anesthesia Complications (+) PONV and history of anesthetic complications  Airway Mallampati: II TM Distance: >3 FB Neck ROM: Full    Dental  (+) Poor Dentition and Dental Advisory Given   Pulmonary neg pulmonary ROS,    Pulmonary exam normal       Cardiovascular hypertension, Pt. on medications     Neuro/Psych negative neurological ROS  negative psych ROS   GI/Hepatic Neg liver ROS, hiatal hernia, GERD-  Medicated,  Endo/Other  Morbid obesity  Renal/GU negative Renal ROS     Musculoskeletal   Abdominal   Peds  Hematology   Anesthesia Other Findings   Reproductive/Obstetrics                           Anesthesia Physical Anesthesia Plan  ASA: II  Anesthesia Plan: General   Post-op Pain Management:    Induction: Intravenous  Airway Management Planned: Nasal ETT  Additional Equipment:   Intra-op Plan:   Post-operative Plan: Extubation in OR  Informed Consent: I have reviewed the patients History and Physical, chart, labs and discussed the procedure including the risks, benefits and alternatives for the proposed anesthesia with the patient or authorized representative who has indicated his/her understanding and acceptance.   Dental advisory given  Plan Discussed with: CRNA, Anesthesiologist and Surgeon  Anesthesia Plan Comments:         Anesthesia Quick Evaluation

## 2012-12-28 NOTE — Anesthesia Procedure Notes (Signed)
Procedure Name: Intubation Date/Time: 12/28/2012 7:45 AM Performed by: Whitman Hero Pre-anesthesia Checklist: Patient identified, Timeout performed, Emergency Drugs available, Suction available and Patient being monitored Patient Re-evaluated:Patient Re-evaluated prior to inductionOxygen Delivery Method: Circle system utilized Preoxygenation: Pre-oxygenation with 100% oxygen Intubation Type: IV induction Ventilation: Mask ventilation without difficulty Laryngoscope Size: Mac and 3 Grade View: Grade I Nasal Tubes: Right, Magill forceps- large, utilized, Nasal prep performed and Nasal Rae Tube size: 7.0 mm Number of attempts: 1 Placement Confirmation: ETT inserted through vocal cords under direct vision,  positive ETCO2 and breath sounds checked- equal and bilateral

## 2012-12-28 NOTE — H&P (Signed)
H&P documentation  -History and Physical Reviewed  -Patient has been re-examined  -No change in the plan of care  Jaretzi Droz M  

## 2012-12-28 NOTE — Transfer of Care (Signed)
Immediate Anesthesia Transfer of Care Note  Patient: Brenda Love  Procedure(s) Performed: Procedure(s): Temporomandibular Joint Arthrotomy; Meniscectomy (Right)  Patient Location: PACU  Anesthesia Type:General  Level of Consciousness: awake, alert  and oriented  Airway & Oxygen Therapy: Patient Spontanous Breathing and Patient connected to nasal cannula oxygen  Post-op Assessment: Report given to PACU RN and Post -op Vital signs reviewed and stable  Post vital signs: Reviewed and stable  Complications: No apparent anesthesia complications

## 2012-12-29 NOTE — Op Note (Signed)
Brenda Love, Brenda Love NO.:  [id number]  MEDICAL RECORD NO.:  [id number]  LOCATION:                               FACILITY:  [facility]  PHYSICIAN:  Georgia Lopes, M.D.  DATE OF BIRTH:  1955-04-08  DATE OF PROCEDURE:  12/28/2012 DATE OF DISCHARGE:  12/28/2012                              OPERATIVE REPORT   PREOPERATIVE DIAGNOSIS:  Anterior dislocation of the meniscus of the right temporomandibular joint.  POSTOPERATIVE DIAGNOSIS:  Anterior dislocation of the meniscus of the right temporomandibular joint.  PROCEDURE:  Right temporomandibular joint arthrotomy with meniscectomy.  SURGEON:  Georgia Lopes, MD  ANESTHESIA:  General, Quita Skye. Krista Blue, MD, attending nasal intubation.  INDICATIONS FOR PROCEDURE:  Brenda Love is a 57 year old female who is referred to me by her general dentist for evaluation of right TMJ pain.  She complained of clicking, popping for approximately 2 years.  An MRI was obtained which demonstrated right TMJ anterior dislocation of the meniscus and normal left TMJ outpatient arthrocentesis was performed in May 2014, which gave some relief, but the symptoms returned.  Multiple options were discussed with the patient, including no surgery, management via medications, management via physical therapy.  Repeat arthrocentesis, fabrication of a TMJ bite splint, and surgery, the patient opted for surgery.  Risks and complications were discussed with the patient and questions were answered prior to the surgery.  PROCEDURE IN DETAIL:  The patient was taken to the operating room, placed on the table in supine position.  General anesthesia was administered intravenously and a nasal endotracheal tube was placed atraumatically and secured and the eyes were protected.  The table was turned and the patient's upper and lower partial dentures were placed in the mouth to allow for proper occlusion during surgery.  Then, the right temporal region was shaved with a  razor and then the area was prepped and draped for the procedure.  A time-out was performed and then a sterile marking pen was used to demarcate a preauricular incision. There was an anterior extension into the temporal region, which curved anteriorly and approximately 2 cm arc to allow for flap development and exposure.  Then 2% lidocaine 1:100,000 epinephrine was infiltrated along the line of incision and into the joint space.  A 15 blade was then used to make a skin incision throughout the length of the marked incision line.  Dissection was carried down through the skin and subcutaneous tissues.  Bleeding vessels were cauterized with the Bovie electrocautery.  Dissection was performed first in the superior most aspect of the incision using hemostats.  Blunt dissection was carried until the superficial layer of deep temporal fascia was encountered. This was used as a plane developed through the entire length of the incision by inserting blunt hemostats to dissect onto his plane and then incise the tissues with the Bovie cautery.  Then, the dissection was carried forward along the zygomatic arch, until the joint capsule was encountered.  Then a #15 blade was used to incise along the zygomatic arch, and dissect superiorly into the joint space.  Another parallel incision was made and at the neck of the condyle and dissection from below with  the [employer] was used to enter the inferior joint space.  The jaw was opened and closed via a mandibular bone clamp which was placed percutaneously at the angle of the mandible.  The disk was freed anteriorly, but did not obtain proper motion when the mouth was opened and closed and the disk was somewhat deformed.  The decision was made at that time, to remove the disk.  Beaver blade straight and angles were used to remove the disk by cutting anteriorly and then posteriorly and then medially.  Small fragments of the disk medially were removed with  the pituitary rongeurs.  Then the mouth was opened and closed, removed in multiple directions and there was found to be good tracking of the condyle in the glenoid fossa, without bony interferences and the area was irrigated and capsule was closed with 4-0 Vicryl, deep muscular layer was closed with 4-0 Vicryl, and subcuticular was closed with 4-0 Vicryl.  Then, the skin was closed with 5-0 Prolene.  Then, bacitracin, Telfa, fluffs, and Ace wrap were placed as a head dressing.  The patient was then awakened, extubated, taken to recovery room, breathing spontaneously in good condition.  ESTIMATED BLOOD LOSS:  Minimum.  COMPLICATIONS:  None.  SPECIMEN:  Right temporomandibular joint disk.     Georgia Lopes, M.D.   ______________________________ Georgia Lopes, M.D.    SMJ/MEDQ  D:  12/28/2012  T:  12/28/2012  Job:  [id number]

## 2012-12-31 ENCOUNTER — Encounter (HOSPITAL_COMMUNITY): Payer: Self-pay | Admitting: Oral Surgery

## 2013-01-04 ENCOUNTER — Other Ambulatory Visit: Payer: Self-pay

## 2013-01-04 MED ORDER — OMEPRAZOLE 20 MG PO CPDR: 20.0000 mg | DELAYED_RELEASE_CAPSULE | Freq: Every day | ORAL | Status: DC

## 2013-04-16 ENCOUNTER — Ambulatory Visit (HOSPITAL_COMMUNITY)
Admission: RE | Admit: 2013-04-16 | Discharge: 2013-04-16 | Disposition: A | Discharge status: Home / Self Care | Payer: 59 | Source: Ambulatory Visit | Attending: Oral Surgery | Admitting: Oral Surgery

## 2013-04-16 DIAGNOSIS — M26609 Unspecified temporomandibular joint disorder, unspecified side: Secondary | ICD-10-CM

## 2013-04-16 DIAGNOSIS — M26629 Arthralgia of temporomandibular joint, unspecified side: Secondary | ICD-10-CM | POA: Insufficient documentation

## 2013-04-16 DIAGNOSIS — M26659 Arthropathy of unspecified temporomandibular joint: Secondary | ICD-10-CM

## 2013-04-16 DIAGNOSIS — IMO0001 Reserved for inherently not codable concepts without codable children: Secondary | ICD-10-CM | POA: Insufficient documentation

## 2013-04-16 NOTE — Evaluation (Addendum)
Physical Therapy Evaluation  Patient Details  Name: Brenda Love MRN: [id number] Date of Birth: April 27, 1955  Today's Date: 04/16/2013 Time: 1025-1100 PT Time Calculation (min): 35 min Charge: eval 1025-1050; there ex 1050-1100             Visit#: 1 of 12  Re-eval: 05/16/13 Assessment Diagnosis: Rt TMJ Surgical Date: 12/28/12  Authorization: [facility]     Past Medical History:  Past Medical History  Diagnosis Date  . Obesity   . Colon polyps     Dr. Rowe Pavy  . S/P colonoscopy 10/01/2003    Dr Levert Feinstein  . Hypertension   . Hypercholesteremia   . Hard of hearing   . TMJ syndrome   . Complication of anesthesia     woke up during procedure  . PONV (postoperative nausea and vomiting)   . Pneumonia   . GERD (gastroesophageal reflux disease)   . H/O hiatal hernia    Past Surgical History:  Past Surgical History  Procedure Laterality Date  . Vaginal hysterectomy    . Vaginal prolapse repair    . Cholecystectomy    . Tonsillectomy    . Esophagogastroduodenoscopy  03/13/2011    Moderate gastritis in the antrum/SMALL Hiatal hernia  . Ileocolonoscopy  03/13/2011    MILD Diverticulosis in the sigmoid to descending colon/ Internal hemorrhoids/POLYPOID LESIONS IN COLON IC VALVE LESION MOST LIKELY A HEALING ULCER FROM NSAID INJURY HEME POS STOOLS 2o TO HEMORRHOIDS, NSAID COLOPATHY/GASTRITIS  . Breast surgery      Hx: of left lumpectomy  . Arthrotomy Right 12/28/2012    Procedure: Temporomandibular Joint Arthrotomy; Meniscectomy;  Surgeon: Gae Bon, DDS;  Location: [place];  Service: Oral Surgery;  Laterality: Right;    Subjective Symptoms/Limitations Symptoms: Ms. Meinhardt had surgery on  12/28/2012 on her R TMJ for a menisectomy after being diagnosed with and Anterior disloctation of the TMJ.  The surgeon attempted to reduce the dislocated disc but it appears that the disc was not mechanically working properly.  Prior to the surgery she tried injections without any relief.  She  states that she is still in significant pain. She states that the pain is so severe that it makes her head and ear hurt.  She states that her mouth begins to draw up almost all the time,(feeling as if it is locked).    She states it is difficult for her to opern her mouth which makes  it is difficult for her to eat or talk more than five minutes.  The patient states she has a very difficult time going to sleep due to the pain she is experiencing.  Pain Assessment Currently in Pain?: Yes (gets as high as a 10/10 ) Pain Score: 4  Pain Location: Mouth Pain Orientation: Right Pain Type: Chronic pain    Cognition/Observation Observation/Other Assessments Observations: Pt jaw offset to the RT 1 cm  Sensation/Coordination/Flexibility/Functional Tests Functional Tests Functional Tests: opening of mouth 1.0cm before jaw significantly goes to the right. Functional Tests: + for difficulty talking; eating, +pain with yawning  Assessment Palpation Palpation: positive for tenderness over the temporomandibular ligament.  Exercise/Treatments Isometric for opening jaw at rest 5 x 5 seconds Lateral glide to left x 5 Isometric for lateral glide to the left x 5 Jaw protrusion x 5  Modalities Modalities: Ultrasound Manual Therapy Manual Therapy: Myofascial release Myofascial Release: MFR to facial mm to decrease restriction and pain. Ultrasound Ultrasound Location: 1.0 MgHz at 1.2w/cm2 continuous over TMJ x 5 minutes  Physical Therapy  Assessment and Plan PT Assessment and Plan Clinical Impression Statement: Pt is a 58 yo s/p excision of  Rt TMJ disc who has continued pain and dysfunction.  She has been referred to PT to assist in improving the patients jaw function and decreasing the patients pain.  Exam demonstrates tenderness to palpation, decreased Rom and abnormal motion of pt jaw.  Pt will benefit from skilled PT to address these issues and improve the patients quality of life. Rehab  Potential: Good PT Frequency: Min 3X/week PT Duration: 4 weeks PT Treatment/Interventions: Patient/family education;Manual techniques;Therapeutic exercise;Modalities PT Plan: begin gentle distraction/ distraction with anterior glide and Lt lateral glide mobilizations.     Goals Home Exercise Program Pt/caregiver will Perform Home Exercise Program: For increased ROM;For increased strengthening PT Goal: Perform Home Exercise Program - Progress: Goal set today PT Short Term Goals Time to Complete Short Term Goals: 2 weeks PT Short Term Goal 1: Pain to be no greater than a 7/10 to improve quality of life PT Short Term Goal 2: Pt to be able to eat or talk for 15 minutes without difficulty to allow a better quality of life. PT Short Term Goal 3: Pt to be able to open her mouth incisor to incisor 2.7 cm to allow greater ease of eating. PT Long Term Goals Time to Complete Long Term Goals: 4 weeks PT Long Term Goal 1: Pt pain to be no greater than a 4/10 for improved quality of life. PT Long Term Goal 2: Pt to be able to talk or eat for 30 minutes for improved quality of life Long Term Goal 3: Pt to be able to open her mouth 3 cm without deviation to allow easier eating Long Term Goal 4: Pt to be able to get to sleep with 50% greater ease.   Problem List Patient Active Problem List   Diagnosis Date Noted  . 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 Heme positive stool 02/28/2011  . GERD (gastroesophageal reflux disease) 02/28/2011  . Family history of colon cancer 02/28/2011  . Colon polyp 02/28/2011    PT Plan of Care PT Home Exercise Plan: given                   GP    Eddis Pingleton,CINDY 04/16/2013, 5:05 PM  Physician Documentation Your signature is required to indicate approval of the treatment plan as stated above.  Please sign and either send electronically or make a copy of this report for your files and return this physician signed original.   Please  mark one 1.__approve of plan  2. ___approve of plan with the following conditions.   ______________________________                                                          _____________________ Physician Signature  Date  

## 2013-04-20 ENCOUNTER — Ambulatory Visit (HOSPITAL_COMMUNITY)
Admission: RE | Admit: 2013-04-20 | Discharge: 2013-04-20 | Disposition: A | Discharge status: Home / Self Care | Payer: 59 | Source: Ambulatory Visit | Attending: Oral Surgery | Admitting: Oral Surgery

## 2013-04-20 DIAGNOSIS — M26629 Arthralgia of temporomandibular joint, unspecified side: Secondary | ICD-10-CM

## 2013-04-20 NOTE — Progress Notes (Addendum)
Physical Therapy Treatment Patient Details  Name: Brenda Love MRN: [id number] Date of Birth: 1955/05/18  Today's Date: 04/20/2013 Time: 1110-1150 PT Time Calculation (min): 40 min Charge:  [place] 2951-8841; mobs 1118-130; there [id number]; manual 1140-1150 Visit#: 2 of 12  Re-eval: 05/16/13    Authorization: [employer]      Subjective: Symptoms/Limitations Symptoms: Pt states that she has tried to complete her exercises.  Pain Assessment Currently in Pain?: Yes Pain Score: 9  Pain Location: Jaw Pain Orientation: Right    Exercise/Treatment Ultrasound at 1.2 w/cm2 x 8:00 to Rt TMJ joint 1mg Hz  Manual Therapy Myofascial Release: MFR to facial mm to decrease restricion and pain  Other Manual Therapy: gentle grade I mobs for distraction, distraction with protrusion and Lt lateral glides.  Pt also completed isometric exercises for distraction and lateral glide. AA ROM for distraction, protrusion and lateral glide.  Physical Therapy Assessment and Plan PT Assessment and Plan Clinical Impression Statement: Pt has significant dysfunction of TMJ.  All motion facilitated by therapist to keep proper alignment. PT Plan: continue with A/AAROM as well as mobs to improve joint function.    Goals    Problem List Patient Active Problem List   Diagnosis Date Noted  . 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 Heme positive stool 02/28/2011  . GERD (gastroesophageal reflux disease) 02/28/2011  . Family history of colon cancer 02/28/2011  . Colon polyp 02/28/2011       GP    RUSSELL,CINDY 04/20/2013, 12:04 PM

## 2013-04-22 ENCOUNTER — Ambulatory Visit (HOSPITAL_COMMUNITY): Payer: 59 | Admitting: *Deleted

## 2013-04-22 ENCOUNTER — Telehealth (HOSPITAL_COMMUNITY): Payer: Self-pay

## 2013-04-26 ENCOUNTER — Ambulatory Visit (HOSPITAL_COMMUNITY): Payer: 59 | Admitting: *Deleted

## 2013-04-29 ENCOUNTER — Inpatient Hospital Stay (HOSPITAL_COMMUNITY): Admission: RE | Admit: 2013-04-29 | Payer: 59 | Source: Ambulatory Visit | Admitting: Physical Therapy

## 2013-05-03 ENCOUNTER — Telehealth (HOSPITAL_COMMUNITY): Payer: Self-pay

## 2013-05-03 ENCOUNTER — Ambulatory Visit (HOSPITAL_COMMUNITY): Payer: 59 | Admitting: *Deleted

## 2013-05-05 ENCOUNTER — Ambulatory Visit (HOSPITAL_COMMUNITY): Payer: 59 | Admitting: Physical Therapy

## 2013-05-07 ENCOUNTER — Ambulatory Visit (HOSPITAL_COMMUNITY): Payer: 59 | Admitting: Physical Therapy

## 2013-05-25 ENCOUNTER — Ambulatory Visit (HOSPITAL_COMMUNITY)
Admission: RE | Admit: 2013-05-25 | Discharge: 2013-05-25 | Disposition: A | Discharge status: Home / Self Care | Payer: 59 | Source: Ambulatory Visit | Attending: Oral Surgery | Admitting: Oral Surgery

## 2013-05-25 DIAGNOSIS — IMO0001 Reserved for inherently not codable concepts without codable children: Secondary | ICD-10-CM | POA: Insufficient documentation

## 2013-05-25 DIAGNOSIS — M26629 Arthralgia of temporomandibular joint, unspecified side: Secondary | ICD-10-CM

## 2013-05-25 NOTE — Evaluation (Addendum)
Physical Therapy Re-Evaluation  Patient Details  Name: Brenda Love MRN: [id number] Date of Birth: April 13, 1955  Today's Date: 05/25/2013 Time: 1100-1150 PT Time Calculation (min): 50 min Charge:  Mm test 1100-1110; [place] 1110-1120; manual 1120-1150             Visit#: 3 of 23  Re-eval: 06/24/13 Assessment Diagnosis: Rt TMJ Surgical Date: 12/28/12  Authorization: [facility]     Past Medical History:  Past Medical History  Diagnosis Date  . Obesity   . Colon polyps     Dr. Rowe Pavy  . S/P colonoscopy 10/01/2003    Dr Levert Feinstein  . Hypertension   . Hypercholesteremia   . Hard of hearing   . TMJ syndrome   . Complication of anesthesia     woke up during procedure  . PONV (postoperative nausea and vomiting)   . Pneumonia   . GERD (gastroesophageal reflux disease)   . H/O hiatal hernia    Past Surgical History:  Past Surgical History  Procedure Laterality Date  . Vaginal hysterectomy    . Vaginal prolapse repair    . Cholecystectomy    . Tonsillectomy    . Esophagogastroduodenoscopy  03/13/2011    Moderate gastritis in the antrum/SMALL Hiatal hernia  . Ileocolonoscopy  03/13/2011    MILD Diverticulosis in the sigmoid to descending colon/ Internal hemorrhoids/POLYPOID LESIONS IN COLON IC VALVE LESION MOST LIKELY A HEALING ULCER FROM NSAID INJURY HEME POS STOOLS 2o TO HEMORRHOIDS, NSAID COLOPATHY/GASTRITIS  . Breast surgery      Hx: of left lumpectomy  . Arthrotomy Right 12/28/2012    Procedure: Temporomandibular Joint Arthrotomy; Meniscectomy;  Surgeon: Gae Bon, DDS;  Location: [place];  Service: Oral Surgery;  Laterality: Right;    Subjective Symptoms/Limitations Symptoms: Pt has missed appointments due to her husband being in the hospital.  Pt states she feels she is getting worse; at times her jaw will snap shut on it's own. Pain Assessment Currently in Pain?: Yes Pain Score: 8  Pain Location: Jaw Pain Orientation: Right Pain Type: Chronic pain     Cognition/Observation Observation/Other Assessments Observations: Pt jaw offsets to the Rt almost immediately was after approximately 1 cm  Sensation/Coordination/Flexibility/Functional Tests Functional Tests Functional Tests: opening of mouth 1.5 cm was  2 cm before jaw significantly goes to the right. Functional Tests: + for difficulty talking; eating, +pain with yawning  Assessment Palpation Palpation: positive for tenderness over the temporomandibular ligament.  Exercise/Treatments Modalities Modalities: Ultrasound Manual Therapy Manual Therapy: Other (comment) Other Manual Therapy: gentle grade I and II mobs for distraction and protursion with lateral glides.  Massaging techniques to Rt massetor mm.  Pt completed contract relax to improve depression of jaw; Lt lateral glides as well as protrusion x 15 each x 2;  Ultrasound Ultrasound Location: Rt TMJ Ultrasound Parameters: 1Mghz at 1.0 w/cm2 continuous over TMJ x 8 minutes at the start of treatment and 2 minutes at the end of the treatment.   Ultrasound Goals: Pain  Physical Therapy Assessment and Plan PT Assessment and Plan Clinical Impression Statement: Pt has only been to therapy for two treatments including the evaluation secondary to her husband being ill.  Pt wants to continue therapy as she felt it was helping until she had to take care of her husband. Pt will benefit from skilled therapeutic intervention in order to improve on the following deficits: Pain;Decreased range of motion;Decreased strength Rehab Potential: Good PT Frequency: Min 3X/week PT Duration: 4 weeks PT Treatment/Interventions: Patient/family education;Manual techniques;Therapeutic exercise;Modalities  PT Plan: continue with A/AAROM as well as mobs to improve joint function.    Goals Home Exercise Program Pt/caregiver will Perform Home Exercise Program: For increased ROM;For increased strengthening PT Goal: Perform Home Exercise Program -  Progress: Not progressing PT Short Term Goals Time to Complete Short Term Goals: 2 weeks PT Short Term Goal 1: Pain to be no greater than a 7/10 to improve quality of life PT Short Term Goal 1 - Progress: Not met PT Short Term Goal 2: Pt to be able to eat or talk for 15 minutes without difficulty to allow a better quality of life. PT Short Term Goal 2 - Progress: Not met PT Short Term Goal 3: Pt to be able to open her mouth incisor to incisor 2.7 cm to allow greater ease of eating. PT Short Term Goal 3 - Progress: Not met PT Long Term Goals Time to Complete Long Term Goals: 4 weeks PT Long Term Goal 1: Pt pain to be no greater than a 4/10 for improved quality of life. PT Long Term Goal 1 - Progress: Not met PT Long Term Goal 2: Pt to be able to talk or eat for 30 minutes for improved quality of life PT Long Term Goal 2 - Progress: Not met Long Term Goal 3: Pt to be able to open her mouth 3 cm without deviation to allow easier eating Long Term Goal 3 Progress: Not met Long Term Goal 4: Pt to be able to get to sleep with 50% greater ease.  Long Term Goal 4 Progress: Not met  Problem List Patient Active Problem List   Diagnosis Date Noted  . 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 Heme positive stool 02/28/2011  . GERD (gastroesophageal reflux disease) 02/28/2011  . Family history of colon cancer 02/28/2011  . Colon polyp 02/28/2011       GP    RUSSELL,CINDY 05/25/2013, 2:59 PM  Physician Documentation Your signature is required to indicate approval of the treatment plan as stated above.  Please sign and either send electronically or make a copy of this report for your files and return this physician signed original.   Please mark one 1.__approve of plan  2. ___approve of plan with the following conditions.   ______________________________                                                          _____________________ Physician Signature                                                                                                              Date

## 2013-05-27 ENCOUNTER — Ambulatory Visit (HOSPITAL_COMMUNITY)
Admission: RE | Admit: 2013-05-27 | Discharge: 2013-05-27 | Disposition: A | Discharge status: Home / Self Care | Payer: 59 | Source: Ambulatory Visit | Attending: Internal Medicine | Admitting: Internal Medicine

## 2013-05-27 DIAGNOSIS — M26629 Arthralgia of temporomandibular joint, unspecified side: Secondary | ICD-10-CM

## 2013-05-27 NOTE — Progress Notes (Signed)
Physical Therapy Treatment Patient Details  Name: Brenda Love MRN: [id number] Date of Birth: 1955/07/23  Today's Date: 05/27/2013 Time: 1020-1105 PT Time Calculation (min): 45 min Charge [place] 1021-1029; manual 1030-1105 Visit#: 4 of 24  Re-eval: 06/24/13   Authorization: [employer]  Subjective: Symptoms/Limitations Symptoms: Pt states she felt better after her last appointment. Pain Assessment Currently in Pain?: Yes Pain Score: 6  Pain Location: Jaw Pain Orientation: Right Pain Type: Chronic pain  Exercise/Treatments  Pt completed exercises with therapist facilitation to ensure proper alignment of jaw.  Depression x 10 with 15 second hold; protrusion x 10 with 15 second hold; Lt lateral glide with depression until alignment is lost hold x 15 seconds.  Contract relax with biting to attempt to gain more depression.   Modalities Modalities: Ultrasound Manual Therapy Manual Therapy: Other (comment) Other Manual Therapy: Grade II mobs for distraction and protrusion with lateral glides.   Ultrasound Ultrasound Location: RT TMJ Ultrasound Parameters:  1.0 Mghz continuous at 1.2 w/cm2 over TMJ and masseter x 8 minutes Ultrasound Goals: Pain  Physical Therapy Assessment and Plan PT Assessment and Plan Clinical Impression Statement: Pt demonstrates improved depression of jaw without lateral shifting.   Pt will benefit from skilled therapeutic intervention in order to improve on the following deficits: Pain;Decreased range of motion;Decreased strength Rehab Potential: Good PT Frequency: Min 3X/week PT Plan: continue with A/AAROM as well as mobs to improve joint function.  Measure amount pt is able to open mouth without lateral shift  (incisor to incisor).  Goals  progressing   Problem List Patient Active Problem List   Diagnosis Date Noted  . 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 Heme positive stool 02/28/2011  . GERD (gastroesophageal reflux  disease) 02/28/2011  . Family history of colon cancer 02/28/2011  . Colon polyp 02/28/2011       GP    Indiah Heyden,CINDY 05/27/2013, 2:07 PM

## 2013-05-31 ENCOUNTER — Ambulatory Visit (HOSPITAL_COMMUNITY): Payer: 59 | Admitting: Physical Therapy

## 2013-06-02 ENCOUNTER — Ambulatory Visit (HOSPITAL_COMMUNITY): Payer: 59 | Admitting: Physical Therapy

## 2013-06-07 ENCOUNTER — Inpatient Hospital Stay (HOSPITAL_COMMUNITY): Admission: RE | Admit: 2013-06-07 | Payer: 59 | Source: Ambulatory Visit | Admitting: Physical Therapy

## 2013-06-07 ENCOUNTER — Telehealth (HOSPITAL_COMMUNITY): Payer: Self-pay | Admitting: Physical Therapy

## 2013-06-09 ENCOUNTER — Ambulatory Visit (HOSPITAL_COMMUNITY): Payer: 59 | Admitting: *Deleted

## 2013-06-11 ENCOUNTER — Ambulatory Visit (HOSPITAL_COMMUNITY): Payer: 59 | Admitting: Physical Therapy

## 2013-06-14 ENCOUNTER — Ambulatory Visit (HOSPITAL_COMMUNITY): Payer: 59 | Admitting: *Deleted

## 2013-06-16 ENCOUNTER — Ambulatory Visit (HOSPITAL_COMMUNITY): Payer: 59 | Admitting: *Deleted

## 2013-06-22 ENCOUNTER — Ambulatory Visit (HOSPITAL_COMMUNITY): Admission: RE | Admit: 2013-06-22 | Payer: 59 | Source: Ambulatory Visit

## 2013-06-24 ENCOUNTER — Ambulatory Visit (HOSPITAL_COMMUNITY)
Admission: RE | Admit: 2013-06-24 | Discharge: 2013-06-24 | Disposition: A | Discharge status: Home / Self Care | Payer: 59 | Source: Ambulatory Visit | Attending: Oral Surgery | Admitting: Oral Surgery

## 2013-06-24 DIAGNOSIS — G8929 Other chronic pain: Secondary | ICD-10-CM | POA: Insufficient documentation

## 2013-06-24 DIAGNOSIS — R6884 Jaw pain: Secondary | ICD-10-CM

## 2013-06-24 DIAGNOSIS — M2669 Other specified disorders of temporomandibular joint: Secondary | ICD-10-CM | POA: Insufficient documentation

## 2013-06-24 DIAGNOSIS — M26629 Arthralgia of temporomandibular joint, unspecified side: Secondary | ICD-10-CM | POA: Insufficient documentation

## 2013-07-05 ENCOUNTER — Encounter (HOSPITAL_COMMUNITY): Payer: Self-pay | Admitting: Pharmacy Technician

## 2013-07-07 ENCOUNTER — Encounter (HOSPITAL_COMMUNITY)
Admission: RE | Admit: 2013-07-07 | Discharge: 2013-07-07 | Disposition: A | Discharge status: Home / Self Care | Payer: 59 | Source: Ambulatory Visit | Attending: Oral Surgery | Admitting: Oral Surgery

## 2013-07-07 ENCOUNTER — Encounter (HOSPITAL_COMMUNITY): Payer: Self-pay

## 2013-07-07 LAB — CBC
HCT: 40.8 % (ref 36.0–46.0)
Hemoglobin: 13.3 g/dL (ref 12.0–15.0)
MCH: 30.1 pg (ref 26.0–34.0)
MCHC: 32.6 g/dL (ref 30.0–36.0)
MCV: 92.3 fL (ref 78.0–100.0)
Platelets: 321 10*3/uL (ref 150–400)
RBC: 4.42 MIL/uL (ref 3.87–5.11)
RDW: 13.2 % (ref 11.5–15.5)
WBC: 5.2 10*3/uL (ref 4.0–10.5)

## 2013-07-07 LAB — BASIC METABOLIC PANEL
BUN: 10 mg/dL (ref 6–23)
CO2: 26 mEq/L (ref 19–32)
Calcium: 9.4 mg/dL (ref 8.4–10.5)
Chloride: 103 mEq/L (ref 96–112)
Creatinine, Ser: 0.55 mg/dL (ref 0.50–1.10)
GFR calc non Af Amer: >90 mL/min (ref >90)
Glucose, Bld: 126 mg/dL — ABNORMAL HIGH (ref 70–99)
Potassium: 4.5 mEq/L (ref 3.7–5.3)
Sodium: 141 mEq/L (ref 137–147)

## 2013-07-07 NOTE — Pre-Procedure Instructions (Signed)
CHARDONAY SCRITCHFIELD  07/07/2013   Your procedure is scheduled on: Friday, Jul 09, 2013 at 7;30 AM  Report to [facility] (use Main Entrance "A'') at 5:30 AM.  Call this number if you have problems the morning of surgery: [phone]   Remember:   Do not eat food or drink liquids after midnight.   Take these medicines the morning of surgery with A SIP OF WATER: metoprolol (LOPRESSOR,  omeprazole (PRILOSEC), if needed: traMADol (ULTRAM) for pain  Stop taking Aspirin, Multivitamins and herbal medications ( Omega-3 Fatty Acids (FISH OIL PEARLS PO) Do not take any NSAIDs ie: Ibuprofen, Advil, Naproxen or any medication containing Aspirin.  Do not wear jewelry, make-up or nail polish.  Do not wear lotions, powders, or perfumes. You may wear deodorant.  Do not shave 48 hours prior to surgery.   Do not bring valuables to the hospital.  [facility] is not responsible for any belongings or valuables.               Contacts, dentures or bridgework may not be worn into surgery.  Leave suitcase in the car. After surgery it may be brought to your room.  For patients admitted to the hospital, discharge time is determined by your treatment team.               Patients discharged the day of surgery will not be allowed to drive home.  Name and phone number of your driver:  Special Instructions:  Special Instructions:Special Instructions: [facility] - Preparing for Surgery  Before surgery, you can play an important role.  Because skin is not sterile, your skin needs to be as free of germs as possible.  You can reduce the number of germs on you skin by washing with CHG (chlorahexidine gluconate) soap before surgery.  CHG is an antiseptic cleaner which kills germs and bonds with the skin to continue killing germs even after washing.  Please DO NOT use if you have an allergy to CHG or antibacterial soaps.  If your skin becomes reddened/irritated stop using the CHG and inform your nurse when you arrive  at Short Stay.  Do not shave (including legs and underarms) for at least 48 hours prior to the first CHG shower.  You may shave your face.  Please follow these instructions carefully:   1.  Shower with CHG Soap the night before surgery and the morning of Surgery.  2.  If you choose to wash your hair, wash your hair first as usual with your normal shampoo.  3.  After you shampoo, rinse your hair and body thoroughly to remove the Shampoo.  4.  Use CHG as you would any other liquid soap.  You can apply chg directly  to the skin and wash gently with scrungie or a clean washcloth.  5.  Apply the CHG Soap to your body ONLY FROM THE NECK DOWN.  Do not use on open wounds or open sores.  Avoid contact with your eyes, ears, mouth and genitals (private parts).  Wash genitals (private parts) with your normal soap.  6.  Wash thoroughly, paying special attention to the area where your surgery will be performed.  7.  Thoroughly rinse your body with warm water from the neck down.  8.  DO NOT shower/wash with your normal soap after using and rinsing off the CHG Soap.  9.  Pat yourself dry with a clean towel.  10.  Wear clean pajamas.            11.  Place clean sheets on your bed the night of your first shower and do not sleep with pets.  Day of Surgery  Do not apply any lotions the morning of surgery.  Please wear clean clothes to the hospital/surgery center.   Please read over the following fact sheets that you were given: Pain Booklet, Coughing and Deep Breathing and Surgical Site Infection Prevention

## 2013-07-07 NOTE — Progress Notes (Signed)
Pt denies SOB, chest pain and being under the care of a cardiologist. Pt denies having a stress test, echo and cardiac cath.  Pt is requesting nausea medication pre-operatively.

## 2013-07-08 MED ORDER — CEFAZOLIN SODIUM-DEXTROSE 2-3 GM-% IV SOLR
2.0000 g | INTRAVENOUS | Status: AC
  Administered 2013-07-09: 2 g via INTRAVENOUS
  Filled 2013-07-08: qty 50

## 2013-07-08 NOTE — H&P (Signed)
HISTORY AND PHYSICAL  Brenda Love is a 58 y.o. female patient with CC: Severe right TMJ pain  HPI: Right TMJ pain multiple years. Conservative/surgical therapy failure.   Bilateral TMJ arthrocentesis 07/30/2012   Right TMJ arthroplasty/meniscectomy 12/28/2012         No diagnosis found.  Past Medical History  Diagnosis Date  . Obesity   . Colon polyps     Dr. Rowe Pavy  . S/P colonoscopy 10/01/2003    Dr Levert Feinstein  . Hypertension   . Hypercholesteremia   . Hard of hearing   . TMJ syndrome   . Complication of anesthesia     woke up during procedure  . PONV (postoperative nausea and vomiting)   . Pneumonia   . GERD (gastroesophageal reflux disease)   . H/O hiatal hernia     No current facility-administered medications for this encounter.   Current Outpatient Prescriptions  Medication Sig Dispense Refill  . cyclobenzaprine (FLEXERIL) 10 MG tablet Take 10 mg by mouth at bedtime as needed for muscle spasms.       Marland Kitchen docusate sodium (COLACE) 100 MG capsule Take 100 mg by mouth every morning.        . metoprolol (LOPRESSOR) 50 MG tablet Take 25-50 mg by mouth 2 (two) times daily. Takes 1 tablet in am and 1/2 tablet in pm      . Multiple Vitamin (MULITIVITAMIN WITH MINERALS) TABS Take 1 tablet by mouth every morning.        . Omega-3 Fatty Acids (FISH OIL PEARLS PO) Take 1 capsule by mouth daily.      Marland Kitchen omeprazole (PRILOSEC) 20 MG capsule Take 1 capsule (20 mg total) by mouth daily.  30 capsule  5  . traMADol (ULTRAM) 50 MG tablet Take 50 mg by mouth every 6 (six) hours as needed for pain.       Allergies  Allergen Reactions  . Codeine Hives and Nausea And Vomiting  . Sulfa Antibiotics Nausea And Vomiting   Active Problems:   * No active hospital problems. *  Vitals: There were no vitals taken for this visit. Lab results: Results for orders placed during the hospital encounter of 07/07/13 (from the past 24 hour(s))  CBC     Status: None   Collection Time    07/07/13   2:50 PM      Result Value Ref Range   WBC 5.2  4.0 - 10.5 K/uL   RBC 4.42  3.87 - 5.11 MIL/uL   Hemoglobin 13.3  12.0 - 15.0 g/dL   HCT 40.8  36.0 - 46.0 %   MCV 92.3  78.0 - 100.0 fL   MCH 30.1  26.0 - 34.0 pg   MCHC 32.6  30.0 - 36.0 g/dL   RDW 13.2  11.5 - 15.5 %   Platelets 321  150 - 400 K/uL  BASIC METABOLIC PANEL     Status: Abnormal   Collection Time    07/07/13  2:50 PM      Result Value Ref Range   Sodium 141  137 - 147 mEq/L   Potassium 4.5  3.7 - 5.3 mEq/L   Chloride 103  96 - 112 mEq/L   CO2 26  19 - 32 mEq/L   Glucose, Bld 126 (*) 70 - 99 mg/dL   BUN 10  6 - 23 mg/dL   Creatinine, Ser 0.55  0.50 - 1.10 mg/dL   Calcium 9.4  8.4 - 10.5 mg/dL   GFR calc non Af  Amer >90  >90 mL/min   GFR calc Af Amer >90  >90 mL/min   Radiology Results: No results found. General appearance: alert, cooperative and moderately obese Head: Normocephalic, without obvious abnormality, atraumatic Eyes: negative Nose: Nares normal. Septum midline. Mucosa normal. No drainage or sinus tenderness. Throat: lips, mucosa, and tongue normal; teeth and gums normal and Maximum opening 79mm.  Pharynx  clear. Neck: no adenopathy, supple, symmetrical, trachea midline, thyroid not enlarged, symmetric, no tenderness/mass/nodules and Right TMJ tenderness. Resp: clear to auscultation bilaterally Cardio: regular rate and rhythm, S1, S2 normal, no murmur, click, rub or gallop  Assessment:Right TMJ Effusion, DJD   Plan: Right TMJ arthrotomy, condylotomy, temporalis fascia graft or dermal fat graft. General anesthesia. Day surgery or admit for observation   Gae Bon 07/08/2013

## 2013-07-09 ENCOUNTER — Encounter (HOSPITAL_COMMUNITY)
Admission: RE | Disposition: A | Discharge status: Home / Self Care | Payer: Self-pay | Source: Ambulatory Visit | Attending: Oral Surgery

## 2013-07-09 ENCOUNTER — Encounter (HOSPITAL_COMMUNITY): Payer: Self-pay | Admitting: *Deleted

## 2013-07-09 ENCOUNTER — Encounter (HOSPITAL_COMMUNITY): Payer: 59 | Admitting: Anesthesiology

## 2013-07-09 ENCOUNTER — Ambulatory Visit (HOSPITAL_COMMUNITY): Payer: 59 | Admitting: Anesthesiology

## 2013-07-09 ENCOUNTER — Ambulatory Visit (HOSPITAL_COMMUNITY)
Admission: RE | Admit: 2013-07-09 | Discharge: 2013-07-09 | Disposition: A | Discharge status: Home / Self Care | Payer: 59 | Source: Ambulatory Visit | Attending: Oral Surgery | Admitting: Oral Surgery

## 2013-07-09 DIAGNOSIS — K635 Polyp of colon: Secondary | ICD-10-CM

## 2013-07-09 DIAGNOSIS — R195 Other fecal abnormalities: Secondary | ICD-10-CM

## 2013-07-09 DIAGNOSIS — Z885 Allergy status to narcotic agent status: Secondary | ICD-10-CM | POA: Insufficient documentation

## 2013-07-09 DIAGNOSIS — M26659 Arthropathy of unspecified temporomandibular joint: Secondary | ICD-10-CM

## 2013-07-09 DIAGNOSIS — I10 Essential (primary) hypertension: Secondary | ICD-10-CM | POA: Insufficient documentation

## 2013-07-09 DIAGNOSIS — M26609 Unspecified temporomandibular joint disorder, unspecified side: Secondary | ICD-10-CM | POA: Insufficient documentation

## 2013-07-09 DIAGNOSIS — Z79899 Other long term (current) drug therapy: Secondary | ICD-10-CM | POA: Insufficient documentation

## 2013-07-09 DIAGNOSIS — E669 Obesity, unspecified: Secondary | ICD-10-CM | POA: Insufficient documentation

## 2013-07-09 DIAGNOSIS — E7800 Pure hypercholesterolemia, unspecified: Secondary | ICD-10-CM | POA: Insufficient documentation

## 2013-07-09 DIAGNOSIS — Z800 Family history of malignant neoplasm of digestive organs: Secondary | ICD-10-CM

## 2013-07-09 DIAGNOSIS — K219 Gastro-esophageal reflux disease without esophagitis: Secondary | ICD-10-CM | POA: Insufficient documentation

## 2013-07-09 DIAGNOSIS — M26629 Arthralgia of temporomandibular joint, unspecified side: Secondary | ICD-10-CM

## 2013-07-09 DIAGNOSIS — Z882 Allergy status to sulfonamides status: Secondary | ICD-10-CM | POA: Insufficient documentation

## 2013-07-09 HISTORY — PX: ARTHROTOMY: SHX5728

## 2013-07-09 SURGERY — ARTHROTOMY
Anesthesia: General | Laterality: Right

## 2013-07-09 MED ORDER — LACTATED RINGERS IV SOLN
INTRAVENOUS | Status: DC | PRN
  Administered 2013-07-09 (×2): via INTRAVENOUS

## 2013-07-09 MED ORDER — OXYCODONE HCL 5 MG PO TABS
5.0000 mg | ORAL_TABLET | Freq: Once | ORAL | Status: AC | PRN
  Administered 2013-07-09: 5 mg via ORAL

## 2013-07-09 MED ORDER — LIDOCAINE HCL (PF) 0.5 % IJ SOLN
INTRAMUSCULAR | Status: AC
  Filled 2013-07-09: qty 50

## 2013-07-09 MED ORDER — FENTANYL CITRATE 0.05 MG/ML IJ SOLN
INTRAMUSCULAR | Status: DC | PRN
  Administered 2013-07-09: 50 ug via INTRAVENOUS
  Administered 2013-07-09: 150 ug via INTRAVENOUS
  Administered 2013-07-09: 50 ug via INTRAVENOUS

## 2013-07-09 MED ORDER — ROCURONIUM BROMIDE 100 MG/10ML IV SOLN
INTRAVENOUS | Status: DC | PRN
  Administered 2013-07-09: 40 mg via INTRAVENOUS

## 2013-07-09 MED ORDER — ONDANSETRON HCL 4 MG/2ML IJ SOLN
INTRAMUSCULAR | Status: AC
  Filled 2013-07-09: qty 2

## 2013-07-09 MED ORDER — DEXAMETHASONE SODIUM PHOSPHATE 4 MG/ML IJ SOLN
INTRAMUSCULAR | Status: DC | PRN
  Administered 2013-07-09: 4 mg via INTRAVENOUS

## 2013-07-09 MED ORDER — ROCURONIUM BROMIDE 50 MG/5ML IV SOLN
INTRAVENOUS | Status: AC
  Filled 2013-07-09: qty 1

## 2013-07-09 MED ORDER — HYDROMORPHONE HCL PF 1 MG/ML IJ SOLN
0.2500 mg | INTRAMUSCULAR | Status: DC | PRN
Start: 2013-07-09 — End: 2013-07-09
  Administered 2013-07-09 (×3): 0.5 mg via INTRAVENOUS

## 2013-07-09 MED ORDER — PROPOFOL 10 MG/ML IV BOLUS
INTRAVENOUS | Status: DC | PRN
  Administered 2013-07-09: 170 mg via INTRAVENOUS

## 2013-07-09 MED ORDER — PROMETHAZINE HCL 25 MG/ML IJ SOLN
6.2500 mg | INTRAMUSCULAR | Status: DC | PRN
Start: 2013-07-09 — End: 2013-07-09
  Administered 2013-07-09: 6.25 mg via INTRAVENOUS

## 2013-07-09 MED ORDER — NEOSTIGMINE METHYLSULFATE 10 MG/10ML IV SOLN
INTRAVENOUS | Status: AC
  Filled 2013-07-09: qty 1

## 2013-07-09 MED ORDER — LIDOCAINE HCL (CARDIAC) 20 MG/ML IV SOLN
INTRAVENOUS | Status: AC
  Filled 2013-07-09: qty 5

## 2013-07-09 MED ORDER — LIDOCAINE-EPINEPHRINE 2 %-1:100000 IJ SOLN
INTRAMUSCULAR | Status: AC
  Filled 2013-07-09: qty 1

## 2013-07-09 MED ORDER — GLYCOPYRROLATE 0.2 MG/ML IJ SOLN
INTRAMUSCULAR | Status: AC
  Filled 2013-07-09: qty 3

## 2013-07-09 MED ORDER — MIDAZOLAM HCL 5 MG/5ML IJ SOLN
INTRAMUSCULAR | Status: DC | PRN
  Administered 2013-07-09: 2 mg via INTRAVENOUS

## 2013-07-09 MED ORDER — PHENYLEPHRINE HCL 10 MG/ML IJ SOLN
INTRAMUSCULAR | Status: DC | PRN
  Administered 2013-07-09: 80 ug via INTRAVENOUS
  Administered 2013-07-09 (×2): 40 ug via INTRAVENOUS

## 2013-07-09 MED ORDER — BUPIVACAINE HCL 0.5 % IJ SOLN
INTRAMUSCULAR | Status: DC | PRN
  Administered 2013-07-09: 10 mL

## 2013-07-09 MED ORDER — BUPIVACAINE HCL 0.5 % IJ SOLN
INTRAMUSCULAR | Status: AC
  Filled 2013-07-09: qty 1

## 2013-07-09 MED ORDER — OXYCODONE-ACETAMINOPHEN 10-325 MG PO TABS: 1.0000 | ORAL_TABLET | ORAL | Status: DC | PRN

## 2013-07-09 MED ORDER — 0.9 % SODIUM CHLORIDE (POUR BTL) OPTIME
TOPICAL | Status: DC | PRN
  Administered 2013-07-09: 1000 mL

## 2013-07-09 MED ORDER — LIDOCAINE HCL (CARDIAC) 20 MG/ML IV SOLN
INTRAVENOUS | Status: DC | PRN
  Administered 2013-07-09: 80 mg via INTRAVENOUS

## 2013-07-09 MED ORDER — PHENYLEPHRINE 40 MCG/ML (10ML) SYRINGE FOR IV PUSH (FOR BLOOD PRESSURE SUPPORT)
PREFILLED_SYRINGE | INTRAVENOUS | Status: AC
  Filled 2013-07-09: qty 10

## 2013-07-09 MED ORDER — BACITRACIN 500 UNIT/GM EX OINT
TOPICAL_OINTMENT | CUTANEOUS | Status: DC | PRN
Start: 2013-07-09 — End: 2013-07-09
  Administered 2013-07-09: 1 via TOPICAL

## 2013-07-09 MED ORDER — ONDANSETRON HCL 4 MG PO TABS: 4.0000 mg | ORAL_TABLET | Freq: Three times a day (TID) | ORAL | Status: DC | PRN

## 2013-07-09 MED ORDER — ONDANSETRON HCL 4 MG/2ML IJ SOLN
INTRAMUSCULAR | Status: DC | PRN
  Administered 2013-07-09: 4 mg via INTRAVENOUS

## 2013-07-09 MED ORDER — LIDOCAINE-EPINEPHRINE 2 %-1:100000 IJ SOLN
INTRAMUSCULAR | Status: DC | PRN
  Administered 2013-07-09: 10 mL via INTRADERMAL

## 2013-07-09 MED ORDER — BACITRACIN ZINC 500 UNIT/GM EX OINT
TOPICAL_OINTMENT | CUTANEOUS | Status: AC
  Filled 2013-07-09: qty 15

## 2013-07-09 MED ORDER — OXYCODONE HCL 5 MG PO TABS
ORAL_TABLET | ORAL | Status: AC
  Filled 2013-07-09: qty 1

## 2013-07-09 MED ORDER — NEOSTIGMINE METHYLSULFATE 10 MG/10ML IV SOLN
INTRAVENOUS | Status: DC | PRN
  Administered 2013-07-09: 3 mg via INTRAVENOUS

## 2013-07-09 MED ORDER — DEXAMETHASONE SODIUM PHOSPHATE 4 MG/ML IJ SOLN
INTRAMUSCULAR | Status: AC
  Filled 2013-07-09: qty 1

## 2013-07-09 MED ORDER — PROMETHAZINE HCL 25 MG/ML IJ SOLN
INTRAMUSCULAR | Status: AC
  Filled 2013-07-09: qty 1

## 2013-07-09 MED ORDER — HYDROMORPHONE HCL PF 1 MG/ML IJ SOLN
INTRAMUSCULAR | Status: AC
  Filled 2013-07-09: qty 1

## 2013-07-09 MED ORDER — GLYCOPYRROLATE 0.2 MG/ML IJ SOLN
INTRAMUSCULAR | Status: DC | PRN
  Administered 2013-07-09: 0.4 mg via INTRAVENOUS

## 2013-07-09 MED ORDER — FENTANYL CITRATE 0.05 MG/ML IJ SOLN
INTRAMUSCULAR | Status: AC
  Filled 2013-07-09: qty 5

## 2013-07-09 MED ORDER — ARTIFICIAL TEARS OP OINT
TOPICAL_OINTMENT | OPHTHALMIC | Status: AC
  Filled 2013-07-09: qty 3.5

## 2013-07-09 MED ORDER — OXYMETAZOLINE HCL 0.05 % NA SOLN
1.0000 | NASAL | Status: AC
  Administered 2013-07-09: 2 via NASAL

## 2013-07-09 MED ORDER — SCOPOLAMINE 1 MG/3DAYS TD PT72
MEDICATED_PATCH | TRANSDERMAL | Status: AC
  Administered 2013-07-09: 1 via TRANSDERMAL
  Filled 2013-07-09: qty 1

## 2013-07-09 MED ORDER — OXYCODONE HCL 5 MG/5ML PO SOLN: 5.0000 mg | Freq: Once | ORAL | Status: AC | PRN

## 2013-07-09 MED ORDER — PROPOFOL 10 MG/ML IV BOLUS
INTRAVENOUS | Status: AC
  Filled 2013-07-09: qty 20

## 2013-07-09 MED ORDER — MIDAZOLAM HCL 2 MG/2ML IJ SOLN
INTRAMUSCULAR | Status: AC
  Filled 2013-07-09: qty 2

## 2013-07-09 SURGICAL SUPPLY — 71 items
ATTRACTOMAT 16X20 MAGNETIC DRP (DRAPES) ×3 IMPLANT
BANDAGE ELASTIC 4 VELCRO ST LF (GAUZE/BANDAGES/DRESSINGS) ×3 IMPLANT
BANDAGE GAUZE ELAST BULKY 4 IN (GAUZE/BANDAGES/DRESSINGS) ×3 IMPLANT
BLADE 10 SAFETY STRL DISP (BLADE) ×3 IMPLANT
BLADE MINI 60D BLUE (BLADE) ×5 IMPLANT
BLADE RECIP (BLADE) IMPLANT
BLADE RECIPRO TAPERED (BLADE) ×2 IMPLANT
BLADE SCLERAL 3.0 60 BEV DOWN (BLADE) ×3 IMPLANT
BLADE SURG CLIPPER 3M 9600 (MISCELLANEOUS) ×3 IMPLANT
BUR CROSS CUT (BURR)
BUR CROSS CUT FISSURE 1.6 (BURR) IMPLANT
BUR CROSS CUT FISSURE 1.6MM (BURR)
BUR EGG ELITE 4.0 (BURR) ×1 IMPLANT
BUR EGG ELITE 4.0MM (BURR) ×1
BUR SABER DIAMOND 2.5 (BURR) IMPLANT
BUR SRG MED 1.6XXCUT FSSR (BURR) IMPLANT
BUR STRYKER TAPERED RND 1.5 (BURR) ×1 IMPLANT
BUR STRYKER TAPERED RND 1.5MM (BURR) ×1
BUR SURG 4X8 MED (BURR) IMPLANT
BURR SRG MED 1.6XXCUT FSSR (BURR)
BURR SURG 4MMX8MM MEDIUM (BURR)
BURR SURG 4X8 MED (BURR)
CANISTER SUCTION 2500CC (MISCELLANEOUS) ×3 IMPLANT
CLEANER TIP ELECTROSURG 2X2 (MISCELLANEOUS) ×5 IMPLANT
COVER SURGICAL LIGHT HANDLE (MISCELLANEOUS) ×5 IMPLANT
CRADLE DONUT ADULT HEAD (MISCELLANEOUS) ×3 IMPLANT
DECANTER SPIKE VIAL GLASS SM (MISCELLANEOUS) ×4 IMPLANT
DRAPE SURG 17X23 STRL (DRAPES) ×6 IMPLANT
DRESSING TELFA 8X3 (GAUZE/BANDAGES/DRESSINGS) ×3 IMPLANT
ELECT COATED BLADE 2.86 ST (ELECTRODE) ×3 IMPLANT
ELECT PAD GROUND ADT 9 (MISCELLANEOUS) ×3 IMPLANT
GAUZE SPONGE 4X4 16PLY XRAY LF (GAUZE/BANDAGES/DRESSINGS) ×2 IMPLANT
GLOVE BIO SURGEON STRL SZ 6.5 (GLOVE) ×3 IMPLANT
GLOVE BIO SURGEON STRL SZ7.5 (GLOVE) ×5 IMPLANT
GLOVE BIO SURGEONS STRL SZ 6.5 (GLOVE) ×2
GLOVE BIOGEL PI IND STRL 7.0 (GLOVE) ×1 IMPLANT
GLOVE BIOGEL PI INDICATOR 7.0 (GLOVE) ×4
GLOVE SURG SS PI 6.5 STRL IVOR (GLOVE) ×2 IMPLANT
GOWN STRL REUS W/ TWL LRG LVL3 (GOWN DISPOSABLE) ×1 IMPLANT
GOWN STRL REUS W/ TWL XL LVL3 (GOWN DISPOSABLE) ×1 IMPLANT
GOWN STRL REUS W/TWL LRG LVL3 (GOWN DISPOSABLE) ×6
GOWN STRL REUS W/TWL XL LVL3 (GOWN DISPOSABLE) ×3
KIT BASIN OR (CUSTOM PROCEDURE TRAY) ×3 IMPLANT
KIT ROOM TURNOVER OR (KITS) ×3 IMPLANT
LOCATOR NERVE 3 VOLT (DISPOSABLE) IMPLANT
NEEDLE 22X1 1/2 (OR ONLY) (NEEDLE) ×6 IMPLANT
NS IRRIG 1000ML POUR BTL (IV SOLUTION) ×3 IMPLANT
PAD ARMBOARD 7.5X6 YLW CONV (MISCELLANEOUS) ×6 IMPLANT
PENCIL BUTTON HOLSTER BLD 10FT (ELECTRODE) ×5 IMPLANT
RASP HELIOCORDIAL MED (MISCELLANEOUS) IMPLANT
SPONGE GAUZE 4X4 12PLY (GAUZE/BANDAGES/DRESSINGS) ×3 IMPLANT
SPONGE GAUZE 4X4 12PLY STER LF (GAUZE/BANDAGES/DRESSINGS) ×2 IMPLANT
SUT CHROMIC 3 0 PS 2 (SUTURE) ×5 IMPLANT
SUT CHROMIC 4 0 P 3 18 (SUTURE) IMPLANT
SUT ETHILON 5 0 P 3 18 (SUTURE)
SUT MERSILENE 4 0 P 3 (SUTURE) ×6 IMPLANT
SUT NYLON ETHILON 5-0 P-3 1X18 (SUTURE) IMPLANT
SUT PROLENE 5 0 P 3 (SUTURE) ×5 IMPLANT
SUT PROLENE 5 0 PS 2 (SUTURE) IMPLANT
SUT SILK 2 0 (SUTURE) ×6
SUT SILK 2-0 18XBRD TIE 12 (SUTURE) ×1 IMPLANT
SUT VIC AB 4-0 PC1 18 (SUTURE) ×5 IMPLANT
SUT VIC AB 4-0 PS2 27 (SUTURE) ×2 IMPLANT
SYR CONTROL 10ML LL (SYRINGE) ×7 IMPLANT
TOWEL OR 17X24 6PK STRL BLUE (TOWEL DISPOSABLE) ×3 IMPLANT
TOWEL OR 17X26 10 PK STRL BLUE (TOWEL DISPOSABLE) ×3 IMPLANT
TRAY ENT MC OR (CUSTOM PROCEDURE TRAY) ×3 IMPLANT
TUBE CONNECTING 12'X1/4 (SUCTIONS) ×1
TUBE CONNECTING 12X1/4 (SUCTIONS) ×2 IMPLANT
WATER STERILE IRR 1000ML POUR (IV SOLUTION) ×3 IMPLANT
YANKAUER SUCT BULB TIP NO VENT (SUCTIONS) ×3 IMPLANT

## 2013-07-09 NOTE — Progress Notes (Signed)
Pt states pain better and tolerable when askd by daughter ready to go to phase 2 and get ready fort d/c

## 2013-07-09 NOTE — Op Note (Signed)
07/09/2013  9:20 AM  PATIENT:  Brenda Love  58 y.o. female  PRE-OPERATIVE DIAGNOSIS: R TMJ DEGENETIVE JOINT DISEASE  POST-OPERATIVE DIAGNOSIS:  SAME  PROCEDURE:  Procedure(s): Right TMJ ARTHROTOMY, CONDYLECTOMY,TMJ disc fragment removal  AND TEMPORALIS Fascia pedicle GRAFT  SURGEON:  Surgeon(s): Gae Bon, DDS  ANESTHESIA:   local and general  EBL:  minimal  DRAINS: none   SPECIMEN:  Right TMJ condyle and disc fragments  COUNTS:  YES  PLAN OF CARE: Discharge to home after PACU  PATIENT DISPOSITION:  PACU - hemodynamically stable.   PROCEDURE DETAILS: Dictation #[id number]  Gae Bon, DMD 07/09/2013 9:20 AM

## 2013-07-09 NOTE — Progress Notes (Signed)
Pt pre medicated with nausea medicicne per daughters request to help prevent n/v on ride home

## 2013-07-09 NOTE — Transfer of Care (Signed)
Immediate Anesthesia Transfer of Care Note  Patient: Brenda Love  Procedure(s) Performed: Procedure(s): ARTHROTOMY CONDYLECTOMY,TMJ disc fragment removal  AND TEMPORALIS GRAFT (Right)  Patient Location: PACU  Anesthesia Type:General  Level of Consciousness: awake, alert  and oriented  Airway & Oxygen Therapy: Patient Spontanous Breathing and Patient connected to nasal cannula oxygen  Post-op Assessment: Report given to PACU RN, Post -op Vital signs reviewed and stable and Patient moving all extremities X 4  Post vital signs: Reviewed and stable  Complications: No apparent anesthesia complications

## 2013-07-09 NOTE — Anesthesia Procedure Notes (Signed)
Procedure Name: Intubation Date/Time: 07/09/2013 7:46 AM Performed by: Trixie Deis A Pre-anesthesia Checklist: Patient identified, Timeout performed, Emergency Drugs available, Suction available and Patient being monitored Patient Re-evaluated:Patient Re-evaluated prior to inductionOxygen Delivery Method: Circle system utilized Preoxygenation: Pre-oxygenation with 100% oxygen Intubation Type: IV induction Ventilation: Mask ventilation without difficulty Grade View: Grade I Nasal Tubes: Nasal Rae, Left, Magill forceps- large, utilized and Nasal prep performed Tube size: 7.0 mm Number of attempts: 1 Airway Equipment and Method: Video-laryngoscopy Placement Confirmation: ETT inserted through vocal cords under direct vision,  breath sounds checked- equal and bilateral and positive ETCO2 Secured at: 26 cm Tube secured with: Tape Dental Injury: Teeth and Oropharynx as per pre-operative assessment  Difficulty Due To: Difficult Airway- due to limited oral opening and Difficulty was anticipated

## 2013-07-09 NOTE — H&P (Signed)
H&P documentation  -History and Physical Reviewed  -Patient has been re-examined  -No change in the plan of care  Brenda Love Parthenia Ames

## 2013-07-09 NOTE — Anesthesia Postprocedure Evaluation (Signed)
  Anesthesia Post-op Note  Patient: Brenda Love  Procedure(s) Performed: Procedure(s): ARTHROTOMY CONDYLECTOMY,TMJ disc fragment removal  AND TEMPORALIS GRAFT (Right)  Patient Location: PACU  Anesthesia Type:General  Level of Consciousness: awake and alert   Airway and Oxygen Therapy: Patient Spontanous Breathing  Post-op Pain: mild  Post-op Assessment: Post-op Vital signs reviewed  Post-op Vital Signs: stable  Last Vitals:  Filed Vitals:   07/09/13 1015  BP: 139/73  Pulse:   Temp:   Resp:     Complications: No apparent anesthesia complications

## 2013-07-09 NOTE — Anesthesia Preprocedure Evaluation (Signed)
Anesthesia Evaluation  Patient identified by MRN, date of birth, ID band Patient awake    Reviewed: Allergy & Precautions, H&P , Patient's Chart, lab work & pertinent test results, reviewed documented beta blocker date and time   History of Anesthesia Complications (+) PONV and history of anesthetic complications  Airway Mallampati: II TM Distance: >3 FB Neck ROM: Full    Dental  (+) Poor Dentition, Dental Advisory Given   Pulmonary neg pulmonary ROS, former smoker,  breath sounds clear to auscultation  Pulmonary exam normal       Cardiovascular hypertension, Pt. on medications Rhythm:Regular Rate:Normal     Neuro/Psych negative neurological ROS  negative psych ROS   GI/Hepatic Neg liver ROS, hiatal hernia, GERD-  Medicated,  Endo/Other  Morbid obesity  Renal/GU      Musculoskeletal   Abdominal   Peds  Hematology   Anesthesia Other Findings   Reproductive/Obstetrics                           Anesthesia Physical Anesthesia Plan  ASA: III  Anesthesia Plan: General   Post-op Pain Management:    Induction: Intravenous  Airway Management Planned: Nasal ETT  Additional Equipment:   Intra-op Plan:   Post-operative Plan: Extubation in OR  Informed Consent: I have reviewed the patients History and Physical, chart, labs and discussed the procedure including the risks, benefits and alternatives for the proposed anesthesia with the patient or authorized representative who has indicated his/her understanding and acceptance.   Dental advisory given  Plan Discussed with: CRNA and Surgeon  Anesthesia Plan Comments:         Anesthesia Quick Evaluation

## 2013-07-10 NOTE — Op Note (Signed)
Brenda Love, Brenda Love NO.:  [id number]  MEDICAL RECORD NO.:  [id number]  LOCATION:  [facility]                         FACILITY:  [facility]  PHYSICIAN:  Gae Bon, M.D.  DATE OF BIRTH:  04-Nov-1955  DATE OF PROCEDURE:  07/09/2013 DATE OF DISCHARGE:  07/09/2013                              OPERATIVE REPORT   PREOPERATIVE DIAGNOSIS:  Right temporomandibular degenerative joint disease  POSTOPERATIVE DIAGNOSIS:  Right temporomandibular degenerative joint disease.  PROCEDURE:  Right TMJ arthrotomy, condylectomy, TMJ disk fragment removal, and temporalis fascia pedicle graft.  SURGEON:  Gae Bon, M.D.  ANESTHESIA:  General, Dr. Orene Desanctis, attending.  INDICATIONS FOR PROCEDURE:  Brenda Love is a 59 year old, who is seen in my office at the request of her general dentist approximately 1 year ago for severe right TMJ pain.  She had undergone conservative therapy with TMJ splint and anti-inflammatory medications and failed conservative treatment.  On Jul 30, 2012, bilateral TMJ arthrocentesis was performed in the office.  The patient did not improve significantly on December 28, 2012, right TMJ arthroplasty.  Meniscectomy was performed.  However, the patient felt an infusion and continued degenerative changes and continued to have pain.  Repeat MRI was obtained, and based on the findings it was recommended that the patient undergo condylectomy with removal of any remaining disk fragments with a temporalis fascia graft.  PROCEDURE IN DETAIL:  The patient was taken to the operating room, placed on the table in supine position.  General anesthesia was administered intravenously and a nasal endotracheal tube was placed and secured.  The eyes were protected.  The table was turned.  The right temporal region was shaved and then prepped and draped.  A time-out was performed.  Then, sterile marking pen was used to demarcate preauricular incision along the line of the previous  scar from the last surgery, but the superior portion was extended further superiorly and curved more anteriorly.  Then, 2% lidocaine with 1:100,000 epinephrine was infiltrated along the line of the incision and into the joint space.  A 18-gauge needle was used then to aspirate fluid in the joint and submitted for serology.  Then, a #15 blade was used to make an incision throughout the right preauricular area.  Dissection was carried down in the right temporal region first to reach the superficial layer of deep temporal fascia.  This was used as a plane of dissection throughout the entire incision.  The tragal cartilage was dissected to join the superior and inferior portion of the incision.  Then, the condyle was palpated and a jaw clamp was placed percutaneously at the inferior border of the right mandibular angle.  The zygomatic arch was encountered and dissected anteriorly, sharply, and then the capsule of the joint was exposed.  The superior joint space was entered with a Soil scientist, and there were some loose disk fragments.  There was degenerative changes with irregular bone formation on the condylar head. The mouth would not open and close adequately until the area was freed up more completely.  The condylar neck was exposed and then decision was made to perform the osteotomy and remove the head of the condyle. Reciprocating saw was  used to remove the condyle under irrigation with care taken to not extend beyond the medial cortex of the condylar head. Then, the bone was separated with a periosteal elevator and then removed with the rongeurs.  The edges of the stump were filed and smoothed with the egg-shaped bur and a bone file.  Then, the temporalis pedicle fascia was harvested using the paddle inferiorly and carrying superiorly approximately 3 cm x 2 cm.  This was rotated into the joint fossa and sutured there with 4-0 Mersilene.  The mouth was opened and closed found to have  good movement.  Then, the deep tissues were closed with the 4-0 Vicryl, subcuticular was 4-0 Vicryl, and the skin with 6-0 Prolene. Then, 0.5% Marcaine, no epi x10 mL was infiltrated into the joint and they saw around the tissues.  Then, the area was cleaned and bacitracin, Telfa, fluffs, Kerlix, and Ace wrap were placed.  The patient was awakened and extubated, and taken to the recovery room breathing spontaneously in good condition.  Estimated blood loss Minimal.  Complications, none.  Specimens, none.     Gae Bon, M.D.     SMJ/MEDQ  D:  07/09/2013  T:  07/10/2013  Job:  [id number]

## 2013-07-13 ENCOUNTER — Encounter (HOSPITAL_COMMUNITY): Payer: Self-pay | Admitting: Oral Surgery

## 2013-07-19 ENCOUNTER — Other Ambulatory Visit: Payer: Self-pay | Admitting: Gastroenterology

## 2013-07-20 NOTE — Telephone Encounter (Signed)
Over two years since last OV. No refills without being seen.

## 2013-08-09 ENCOUNTER — Ambulatory Visit (HOSPITAL_COMMUNITY): Admission: RE | Admit: 2013-08-09 | Payer: 59 | Source: Ambulatory Visit | Admitting: Speech Pathology

## 2013-08-10 ENCOUNTER — Ambulatory Visit (HOSPITAL_COMMUNITY)
Admission: RE | Admit: 2013-08-10 | Discharge: 2013-08-10 | Disposition: A | Discharge status: Home / Self Care | Payer: 59 | Source: Ambulatory Visit | Attending: Internal Medicine | Admitting: Internal Medicine

## 2013-08-10 ENCOUNTER — Ambulatory Visit (HOSPITAL_COMMUNITY)
Admission: RE | Admit: 2013-08-10 | Discharge: 2013-08-10 | Disposition: A | Discharge status: Home / Self Care | Payer: 59 | Source: Ambulatory Visit | Attending: Oral Surgery | Admitting: Oral Surgery

## 2013-08-10 ENCOUNTER — Ambulatory Visit (HOSPITAL_COMMUNITY): Payer: 59 | Admitting: Speech Pathology

## 2013-08-10 DIAGNOSIS — R52 Pain, unspecified: Secondary | ICD-10-CM

## 2013-08-10 DIAGNOSIS — IMO0001 Reserved for inherently not codable concepts without codable children: Secondary | ICD-10-CM | POA: Insufficient documentation

## 2013-08-10 DIAGNOSIS — M26629 Arthralgia of temporomandibular joint, unspecified side: Secondary | ICD-10-CM | POA: Insufficient documentation

## 2013-08-10 NOTE — Evaluation (Signed)
Physical Therapy Evaluation  Patient Details  Name: Brenda Love MRN: [id number] Date of Birth: 1955-12-24  Today's Date: 08/10/2013 Time: 7510-2585 PT Time Calculation (min): 20 min     Charges: 1 Evalaution, manual therapy 2778-2423         Visit#: 1 of 12  Re-eval: 09/09/13 Assessment Diagnosis: Rt Tempomandibular joint pain Surgical Date: 07/09/13 Next MD Visit: 08/16/13 Prior Therapy: yes before surgery  Authorization: [facility]     Past Medical History:  Past Medical History  Diagnosis Date  . Obesity   . Colon polyps     Dr. Rowe Pavy  . S/P colonoscopy 10/01/2003    Dr Levert Feinstein  . Hypertension   . Hypercholesteremia   . Hard of hearing   . TMJ syndrome   . Complication of anesthesia     woke up during procedure  . PONV (postoperative nausea and vomiting)   . Pneumonia   . GERD (gastroesophageal reflux disease)   . H/O hiatal hernia    Past Surgical History:  Past Surgical History  Procedure Laterality Date  . Vaginal hysterectomy    . Vaginal prolapse repair    . Cholecystectomy    . Tonsillectomy    . Esophagogastroduodenoscopy  03/13/2011    Moderate gastritis in the antrum/SMALL Hiatal hernia  . Ileocolonoscopy  03/13/2011    MILD Diverticulosis in the sigmoid to descending colon/ Internal hemorrhoids/POLYPOID LESIONS IN COLON IC VALVE LESION MOST LIKELY A HEALING ULCER FROM NSAID INJURY HEME POS STOOLS 2o TO HEMORRHOIDS, NSAID COLOPATHY/GASTRITIS  . Breast surgery      Hx: of left lumpectomy  . Arthrotomy Right 12/28/2012    Procedure: Temporomandibular Joint Arthrotomy; Meniscectomy;  Surgeon: Gae Bon, DDS;  Location: [place];  Service: Oral Surgery;  Laterality: Right;  . Temporomandibular joint surgery    . Arthrotomy Right 07/09/2013    Procedure: ARTHROTOMY CONDYLECTOMY,TMJ disc fragment removal  AND TEMPORALIS GRAFT;  Surgeon: Gae Bon, DDS;  Location: [place];  Service: Oral Surgery;  Laterality: Right;     Subjective Symptoms/Limitations Symptoms: Patient states having a long history of jaw pain. Patient is constantly moving moth throughout examination for which she is unable to give a reason why, patient also noted head aches. Ptient believes that previous ultrasound treatments were helpful.  Pertinent History: Patient states having a long history of jaw pain as well as severe hearing difficulties resulting in significant comminication difficulties. Patient recently had jaw surgery revision (first surgery unknown, recent surgery May 1st) that she was unable to adequately describe the procedure. Her next appointment is June 8 Patient Stated Goals: to be able to chew food withotu pain.  Pain Assessment Currently in Pain?: Yes Pain Score: 7  Pain Location: Jaw Pain Orientation: Right Pain Type: Chronic pain;Surgical pain Pain Radiating Towards: towards ear Pain Onset: 1 to 4 weeks ago Pain Frequency: Constant Pain Relieving Factors: not moving mouth/jaw Effect of Pain on Daily Activities: pain difficulty chewing.  Multiple Pain Sites: No  Cognition/Observation Observation/Other Assessments Observations: Pt jaw offsets to the Rt thoguh this was inconsistent as patient was unable to slowly perform and/or control motions.   Sensation/Coordination/Flexibility/Functional Tests Functional Tests Functional Tests: Opening and closing the mouh extremely inconsistent in hieight and direction as patient seems to be chewing something or griding her teeth Lt to and from Rt despite patient havign nothign in her mouth and patient trying her best to slowly lower moth without side to side deviations. patient's jaw appears to deviate to Rt secondary  to limited Rt masseter mobility Functional Tests: + for difficulty talking; eating, +pain with yawning Functional Tests: Pain withotu openign mouth on right and pain with trying to shift jaw to the Lt (pain on Rt)  Assessment Palpation Palpation: positive  for tenderness over the temporomandibular ligament, masseter and suboccipital muscles.  Exercise/Treatments 3D cervical spine stretch with UE distraction 4x 10seconds  Manual Therapy Myofascial Release: MFR to facial mm to decrease restricion and pain, upper trapezius and sub occipital release Other Manual Therapy: Grade II mobs for distraction and protrusion with lateral glides  Physical Therapy Assessment and Plan PT Assessment and Plan Clinical Impression Statement: Patient displasy difficulty shewing, talking, openign mouth and jaw pain secondary to limited temporomandibular joint mobility due to limited muscle and joint mobility. Patient will ben3efit from skileld phsycial therapy to decrease jaw pain, and improve ability to eat, talk and sleep with decrease pain or difficulty. Pateint demosntratd good understanding of neck stretches with good performance and decreased head aches and improved jaw pain.      Pt will benefit from skilled therapeutic intervention in order to improve on the following deficits: Pain;Decreased range of motion;Decreased strength;Improper body mechanics;Increased fascial restricitons Rehab Potential: Fair Clinical Impairments Affecting Rehab Potential: long history of jaw pain.  PT Frequency: Min 2X/week PT Duration: 6 weeks PT Treatment/Interventions: Patient/family education;Manual techniques;Therapeutic exercise;Modalities PT Plan: Educate next session in jaw mobility exercises and continue to progress cervical spine mobility    Goals Home Exercise Program Pt/caregiver will Perform Home Exercise Program: For increased ROM;For increased strengthening PT Goal: Perform Home Exercise Program - Progress: Goal set today PT Short Term Goals Time to Complete Short Term Goals: 2 weeks PT Short Term Goal 1: Pain to be no greater than a 7/10 to improve quality of life PT Short Term Goal 2: Pt to be able to eat or talk for 15 minutes without difficulty to allow a  better quality of life. PT Short Term Goal 3: Pt to be able to open her mouth incisor to incisor 2.7 cm to allow greater ease of eating. PT Long Term Goals PT Long Term Goal 1: Pt pain to be no greater than a 4/10 for improved quality of life. PT Long Term Goal 1 - Progress: Progressing toward goal PT Long Term Goal 2: Pt to be able to talk or eat for 30 minutes for improved quality of life PT Long Term Goal 2 - Progress: Progressing toward goal Long Term Goal 3: Pt to be able to open her mouth 3 cm without deviation to allow easier eating Long Term Goal 4: Pt to be able to get to sleep with 50% greater ease.  PT Long Term Goal 5: Patient to have no complaint of head aches or ear pain for >1 week.   Problem List Patient Active Problem List   Diagnosis Date Noted  . Pain aggravated by eating or drinking 08/10/2013  . 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 Heme positive stool 02/28/2011  . GERD (gastroesophageal reflux disease) 02/28/2011  . Family history of colon cancer 02/28/2011  . Colon polyp 02/28/2011    PT - End of Session Activity Tolerance: Patient tolerated treatment well General Behavior During Therapy: WFL for tasks assessed/performed PT Plan of Care PT Home Exercise Plan: 3D cervical spine stretch with UE disraction.   GP    Suzette Battiest Delmer Kowalski 08/10/2013, 7:27 PM  Physician Documentation Your signature is required to indicate approval of the treatment plan as  stated above.  Please sign and either send electronically or make a copy of this report for your files and return this physician signed original.   Please mark one 1.__approve of plan  2. ___approve of plan with the following conditions.   ______________________________                                                          _____________________ Physician Signature                                                                                                              Date

## 2013-08-12 ENCOUNTER — Ambulatory Visit (HOSPITAL_COMMUNITY): Payer: 59 | Admitting: Physical Therapy

## 2013-08-16 ENCOUNTER — Ambulatory Visit (HOSPITAL_COMMUNITY)
Admission: RE | Admit: 2013-08-16 | Discharge: 2013-08-16 | Disposition: A | Discharge status: Home / Self Care | Payer: 59 | Source: Ambulatory Visit | Attending: Oral Surgery | Admitting: Oral Surgery

## 2013-08-16 NOTE — Progress Notes (Signed)
Physical Therapy Treatment Patient Details  Name: Brenda Love MRN: [id number] Date of Birth: [birth year]  Today's Date: 08/16/2013 Time: 1100-1130 PT Time Calculation (min): 30 min [place] 1101-1109; manual 1109-1130 Visit#: 2 of 12  Re-eval: 09/09/13    Authorization: [employer]  Subjective: Symptoms/Limitations Symptoms: Pt states that since her second surgery she has been having quite a bit of ear pain as well.  MD checked and cleared her ear.  Pt states she can tell that she can move her mouth more since her second surgery but continues to have pain. Pain Assessment Pain Score: 6  Pain Location: Jaw Pain Orientation: Right Pain Type: Chronic pain;Surgical pain      Exercise/Treatments   Modalities Modalities: Ultrasound Manual Therapy Manual Therapy: Myofascial release Myofascial Release: MFR to facial mm to decrease restriciton and pain. Other Manual Therapy: Grade II mobs for distraction with lateral glide as well as protursion.  Pt completed contract relax to improve opening of LE as well as Jaw protrusion and Lt lateral glides  Ultrasound Ultrasound Location: Rt TMJ Ultrasound Parameters: 1.2 MgHz continuous at 1.2w/cm2  over TMJ and masseter   Physical Therapy Assessment and Plan PT Assessment and Plan Clinical Impression Statement: Pt exhibits improved opening of mouth after treatment.  Pt states she is painfree folllowing [place] and manual techniques.  Pt continues to have difficulty opening mouth without having Rt lateral glide occur.  PT given HEP for opening and lateral glide motions as well as protursion. PT Plan: continue with [place] and manual  techniques.  See how many tongue depressors can fit in pt mouth comfortably.      Goals  progressing  Problem List Patient Active Problem List   Diagnosis Date Noted  . Pain aggravated by eating or drinking 08/10/2013  . 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 Heme positive stool 02/28/2011  .  GERD (gastroesophageal reflux disease) 02/28/2011  . Family history of colon cancer 02/28/2011  . Colon polyp 02/28/2011       GP    Brenda Love 08/16/2013, 11:42 AM

## 2013-08-18 ENCOUNTER — Ambulatory Visit (HOSPITAL_COMMUNITY): Payer: 59 | Admitting: Physical Therapy

## 2013-08-25 ENCOUNTER — Ambulatory Visit (HOSPITAL_COMMUNITY): Payer: 59 | Admitting: Physical Therapy

## 2013-08-27 ENCOUNTER — Ambulatory Visit (HOSPITAL_COMMUNITY): Payer: 59 | Admitting: Physical Therapy

## 2013-08-31 ENCOUNTER — Ambulatory Visit (HOSPITAL_COMMUNITY): Payer: 59 | Admitting: Physical Therapy

## 2013-09-02 ENCOUNTER — Ambulatory Visit (HOSPITAL_COMMUNITY): Payer: 59 | Admitting: Physical Therapy

## 2013-11-23 ENCOUNTER — Other Ambulatory Visit (HOSPITAL_COMMUNITY): Payer: Self-pay | Admitting: Oral Surgery

## 2013-11-23 DIAGNOSIS — R198 Other specified symptoms and signs involving the digestive system and abdomen: Secondary | ICD-10-CM

## 2013-11-25 ENCOUNTER — Other Ambulatory Visit (HOSPITAL_COMMUNITY): Payer: 59

## 2013-11-26 ENCOUNTER — Ambulatory Visit (HOSPITAL_COMMUNITY)
Admission: RE | Admit: 2013-11-26 | Discharge: 2013-11-26 | Disposition: A | Discharge status: Home / Self Care | Payer: 59 | Source: Ambulatory Visit | Attending: Oral Surgery | Admitting: Oral Surgery

## 2013-11-26 DIAGNOSIS — M7989 Other specified soft tissue disorders: Secondary | ICD-10-CM | POA: Insufficient documentation

## 2013-11-26 DIAGNOSIS — M26629 Arthralgia of temporomandibular joint, unspecified side: Secondary | ICD-10-CM | POA: Diagnosis not present

## 2013-11-26 DIAGNOSIS — Z9889 Other specified postprocedural states: Secondary | ICD-10-CM | POA: Diagnosis not present

## 2013-11-26 DIAGNOSIS — R198 Other specified symptoms and signs involving the digestive system and abdomen: Secondary | ICD-10-CM

## 2013-11-26 MED ORDER — IOHEXOL 300 MG/ML  SOLN
80.0000 mL | Freq: Once | INTRAMUSCULAR | Status: AC | PRN
  Administered 2013-11-26: 80 mL via INTRAVENOUS

## 2014-05-10 ENCOUNTER — Ambulatory Visit (INDEPENDENT_AMBULATORY_CARE_PROVIDER_SITE_OTHER): Payer: 59 | Admitting: Neurology

## 2014-05-10 ENCOUNTER — Encounter: Payer: Self-pay | Admitting: Neurology

## 2014-05-10 VITALS — BP 139/82 | HR 70 | Ht 62.0 in | Wt 193.2 lb

## 2014-05-10 DIAGNOSIS — R251 Tremor, unspecified: Secondary | ICD-10-CM | POA: Diagnosis not present

## 2014-05-10 DIAGNOSIS — M791 Myalgia: Secondary | ICD-10-CM | POA: Diagnosis not present

## 2014-05-10 DIAGNOSIS — M7918 Myalgia, other site: Secondary | ICD-10-CM

## 2014-05-10 HISTORY — DX: Myalgia, other site: M79.18

## 2014-05-10 HISTORY — DX: Tremor, unspecified: R25.1

## 2014-05-10 MED ORDER — CYCLOBENZAPRINE HCL 10 MG PO TABS: 10.0000 mg | ORAL_TABLET | Freq: Three times a day (TID) | ORAL | Status: DC | PRN

## 2014-05-10 NOTE — Progress Notes (Signed)
Reason for visit: Myofascial pain syndrome  Brenda Love is a 59 y.o. female  History of present illness:  Brenda Love is a 59 year old right-handed white female with a history of an essential tremor that affects the head and neck that has been present for least 15 years. The patient however indicates that she is coming in today for an evaluation of myofascial pain that involves the right jaw that has been present for greater than one year. She was seen by Dr. Diona Browner previously, and she underwent surgery on 2 occasions. The patient has had increased pain following the surgery. She was seen by Dr. Zigmund Daniel at [facility], and he indicated that the patient has sustained pain because of the fact that the patient did not receive a joint replacement, but the end of the temporomandibular joint was cut off, and the mandible has shifted to the right. The patient has a lot of neuromuscular pain involving the right jaw and into the temporal area. The pain is worse with chewing, and talking. She denies any numbness or weakness otherwise of the face, arms, or legs. She denies any difficulty with speech or swallowing. She is hard of hearing, however. She denies any loss of vision, double vision, or visual alteration. She denies any balance problems or difficulty controlling the bowels or the bladder. She is not having any dizziness. In the past, Flexeril has been helpful for her, but she currently is on baclofen. She has undergone physical therapy on the jaw in the past at [facility], with some benefit.  Past Medical History  Diagnosis Date  . Obesity   . Colon polyps     Dr. Rowe Pavy  . S/P colonoscopy 10/01/2003    Dr Levert Feinstein  . Hypertension   . Hypercholesteremia   . Hard of hearing   . TMJ syndrome   . Complication of anesthesia     woke up during procedure  . PONV (postoperative nausea and vomiting)   . Pneumonia   . GERD (gastroesophageal reflux disease)   . H/O hiatal hernia   . Tremor  05/10/2014  . Myofascial pain on right side 05/10/2014  . HOH (hard of hearing)     hearing aids    Past Surgical History  Procedure Laterality Date  . Vaginal hysterectomy    . Vaginal prolapse repair    . Cholecystectomy    . Tonsillectomy    . Esophagogastroduodenoscopy  03/13/2011    Moderate gastritis in the antrum/SMALL Hiatal hernia  . Ileocolonoscopy  03/13/2011    MILD Diverticulosis in the sigmoid to descending colon/ Internal hemorrhoids/POLYPOID LESIONS IN COLON IC VALVE LESION MOST LIKELY A HEALING ULCER FROM NSAID INJURY HEME POS STOOLS 2o TO HEMORRHOIDS, NSAID COLOPATHY/GASTRITIS  . Breast surgery      Hx: of left lumpectomy  . Arthrotomy Right 12/28/2012    Procedure: Temporomandibular Joint Arthrotomy; Meniscectomy;  Surgeon: Gae Bon, DDS;  Location: [place];  Service: Oral Surgery;  Laterality: Right;  . Temporomandibular joint surgery    . Arthrotomy Right 07/09/2013    Procedure: ARTHROTOMY CONDYLECTOMY,TMJ disc fragment removal  AND TEMPORALIS GRAFT;  Surgeon: Gae Bon, DDS;  Location: [place];  Service: Oral Surgery;  Laterality: Right;    Family History  Problem Relation Age of Onset  . Colon cancer Mother 63  . Hypertension Mother   . Diabetes type I Mother   . Diabetes Sister   . Coronary artery disease Father   . Emphysema Father  Social history:  reports that she quit smoking about 20 years ago. Her smoking use included Cigarettes. She has a 5 pack-year smoking history. She has never used smokeless tobacco. She reports that she does not drink alcohol or use illicit drugs.  Medications:  Prior to Admission medications   Medication Sig Start Date End Date Taking? Authorizing Provider  baclofen (LIORESAL) 10 MG tablet Take 10 mg by mouth 2 (two) times daily. 04/22/14  Yes Historical Provider, MD  metoprolol (LOPRESSOR) 50 MG tablet Take 25-50 mg by mouth 2 (two) times daily. Takes 1 tablet in am and 1/2 tablet in pm 02/01/11  Yes Historical  Provider, MD  Multiple Vitamin (MULITIVITAMIN WITH MINERALS) TABS Take 1 tablet by mouth every morning.     Yes Historical Provider, MD  Omega-3 Fatty Acids (FISH OIL PEARLS PO) Take 1 capsule by mouth daily.   Yes Historical Provider, MD  omeprazole (PRILOSEC) 20 MG capsule Take 1 capsule (20 mg total) by mouth daily. 01/04/13  Yes Orvil Feil, NP  pravastatin (PRAVACHOL) 10 MG tablet Take 10 mg by mouth daily. 04/22/14  Yes Historical Provider, MD  traMADol (ULTRAM) 50 MG tablet Take 50 mg by mouth. Take one tab twice a day as needed. 04/26/14  Yes Historical Provider, MD      Allergies  Allergen Reactions  . Codeine Hives and Nausea And Vomiting  . Sulfa Antibiotics Nausea And Vomiting    ROS:  Out of a complete 14 system review of symptoms, the patient complains only of the following symptoms, and all other reviewed systems are negative.  Hearing loss, ringing in the ears Joint swelling Headache, tremor  Blood pressure 139/82, pulse 70, height 5\' 2"  (1.575 m), weight 193 lb 3.2 oz (87.635 kg).  Physical Exam  General: The patient is alert and cooperative at the time of the examination.  Eyes: Pupils are equal, round, and reactive to light. Discs are flat bilaterally.  Neck: The neck is supple, no carotid bruits are noted.  Respiratory: The respiratory examination is clear.  Cardiovascular: The cardiovascular examination reveals a regular rate and rhythm, no obvious murmurs or rubs are noted.  Skin: Extremities are without significant edema.  Neurologic Exam  Mental status: The patient is alert and oriented x 3 at the time of the examination. The patient has apparent normal recent and remote memory, with an apparently normal attention span and concentration ability.  Cranial nerves: Facial symmetry is not present. There is lateral deviation of the mandible to the right. There is good sensation of the face to pinprick and soft touch bilaterally. The strength of the facial  muscles and the muscles to head turning and shoulder shrug are normal bilaterally. Speech is well enunciated, no aphasia or dysarthria is noted. Extraocular movements are full. Visual fields are full. The tongue is midline, and the patient has symmetric elevation of the soft palate. No obvious hearing deficits are noted. Prominent side-to-side head and neck tremors are noted. The patient is hard of hearing. She is wearing bilateral hearing aids.  Motor: The motor testing reveals 5 over 5 strength of all 4 extremities. Good symmetric motor tone is noted throughout.  Sensory: Sensory testing is intact to pinprick, soft touch, vibration sensation, and position sense on all 4 extremities. No evidence of extinction is noted.  Coordination: Cerebellar testing reveals good finger-nose-finger and heel-to-shin bilaterally.  Gait and station: Gait is normal. Tandem gait is slightly unsteady. Romberg is negative. No drift is seen.  Reflexes: Deep  tendon reflexes are symmetric and normal bilaterally. Toes are downgoing bilaterally.   Assessment/Plan:  1. Essential tremor  2. Myofascial pain syndrome, right  The patient has a prominent head and neck tremor, but she does not wish to have medical therapy for this at this point. She is mainly concerned about the myofascial pain issue. The patient appears to had structural alteration of the right TMJ, and there is lateral shift to the right with the mandible. This has resulted in malocclusion of the jaws, and onset of a myofascial pain syndrome that also likely is associated with intrinsic joint pain. Ultimately, a joint replacement and proper alignment of the jaw may be necessary to improve the chronic pain. The patient does have a neuromuscular component to the pain. She will be switched to Flexeril at this time, and she will be set up for neuromuscular therapy on the jaw. I do not see evidence of cranial nerve injury. She will follow-up in 4 months.  Jill Alexanders MD 05/10/2014 9:34 PM  [facility] [address] [place], [place] [address]  Phone [phone] Fax [phone]

## 2014-05-10 NOTE — Patient Instructions (Signed)
Myofascial Pain Syndrome °Myofascial pain syndrome is a pain disorder. This pain may be felt in the muscles. It may come and go. Myofascial pain syndrome always has trigger or tender points in the muscle that will cause pain when pressed.  °CAUSES °Myofascial pain may be caused by injuries, especially auto accidents, or by overuse of certain muscles. Typically the pain is long lasting. It is made worse by overuse of the involved muscles, emotional distress, and by cold, damp weather. Myofascial pain syndrome often develops in patients whose response to stress is an increase in muscle tone, and is seen in greater frequency in patients with pre-existing tension headaches. °SYMPTOMS  °Myofascial pain syndrome causes a wide variety of symptoms. You may see tight ropy bands of muscle. Problems may also include aching, cramping, burning, numbness, tingling, and other uncomfortable sensations in muscular areas. It most commonly affects the neck, upper back, and shoulder areas. Pain often radiates into the arms and hands.  °TREATMENT °Treatment includes resting the affected muscular area and applying ice packs to reduce spasm and pain. Trigger point injection, is a valuable initial therapy. This therapy is an injection of local anesthetic directly into the trigger point. Trigger points are often present at the source of pain. Pain relief following injection confirms the diagnosis of myofascial pain syndrome. Fairly vigorous therapy can be carried out during the pain-free period after each injection. Stretching exercises to loosen up the muscles are also useful. Transcutaneous electrical nerve stimulation (TENS) may provide relief from pain. TENS is the use of electric current produced by a device to stimulate the nerves. Ultrasound therapy applied directly over the affected muscle may also provide pain relief. Anti-inflammatory pain medicine can be helpful. Symptoms will gradually improve over a period of weeks to months  with proper treatment. °HOME CARE INSTRUCTIONS °Call your caregiver for follow-up care as recommended.  °SEEK MEDICAL CARE IF:  °Your pain is severe and not helped with medications. °Document Released: 04/04/2004 Document Revised: 05/20/2011 Document Reviewed: 04/13/2010 °[employer]® Patient Information ©2015 [employer]. This information is not intended to replace advice given to you by your health care provider. Make sure you discuss any questions you have with your health care provider. ° °

## 2014-06-09 ENCOUNTER — Telehealth: Payer: Self-pay | Admitting: *Deleted

## 2014-06-09 NOTE — Telephone Encounter (Signed)
Brenda Love is calling wanting the status of the Botox referral. Brenda Love states that it has been over a week and they have not heard anything. Explained to Brenda Love that there is a process and it normally takes about a week or so. She would like a call back to have this done as soon as possible.

## 2014-06-09 NOTE — Telephone Encounter (Signed)
Spoke with patients daughter she states patient went to PCP and was advised to come back here for Botox. She was seeking the referral to come back. I advised her she does not need a referral for same problem. That they just need to make an appt and discuss injections with Willis. She verbalized understanding and I transferred her to scheduling

## 2014-09-26 ENCOUNTER — Ambulatory Visit: Payer: 59 | Admitting: Neurology

## 2014-09-26 ENCOUNTER — Telehealth: Payer: Self-pay

## 2014-09-26 NOTE — Telephone Encounter (Signed)
Patient did not come to a f/u appointment.

## 2014-09-30 ENCOUNTER — Encounter: Payer: Self-pay | Admitting: Neurology

## 2015-02-10 ENCOUNTER — Other Ambulatory Visit: Payer: Self-pay | Admitting: Neurology

## 2015-02-12 ENCOUNTER — Other Ambulatory Visit: Payer: Self-pay

## 2015-02-13 ENCOUNTER — Other Ambulatory Visit: Payer: Self-pay | Admitting: Neurology

## 2015-02-14 ENCOUNTER — Other Ambulatory Visit: Payer: Self-pay

## 2015-02-21 ENCOUNTER — Other Ambulatory Visit: Payer: Self-pay | Admitting: Neurology

## 2015-09-10 IMAGING — CT CT MAXILLOFACIAL W/ CM
3 of 5 series · 16 of 47 positions shown, 19 images · IV contrast (Omnipaque 300)
Comparison: TMJ MRI 06/24/2013

CLINICAL DATA: Clenching of teeth. Right temporomandibular joint
pain. History of right TMJ surgery [DATE].

EXAM:
CT MAXILLOFACIAL WITH CONTRAST
TECHNIQUE: Multidetector CT imaging of the maxillofacial structures was
performed with intravenous contrast. Multiplanar CT image
reconstructions were also generated. A small metallic BB was placed
on the right temple in order to reliably differentiate right from
left.
CONTRAST:  80mL OMNIPAQUE IOHEXOL 300 MG/ML  SOLN

[Series 6: max bone coro · coronal · 0.35mm/px · 3 of 104 slices shown]
[im 21/104  bone]
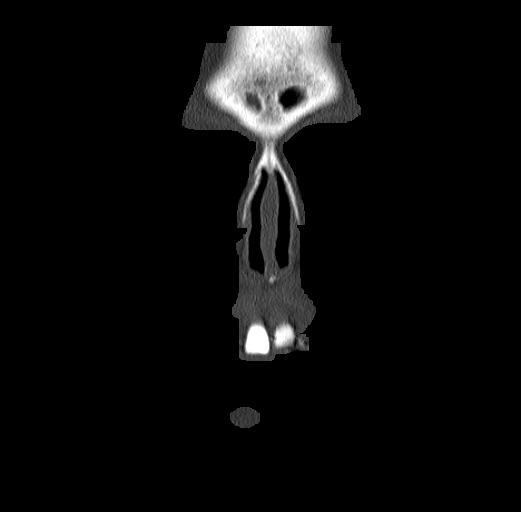
[im 42/104  bone]
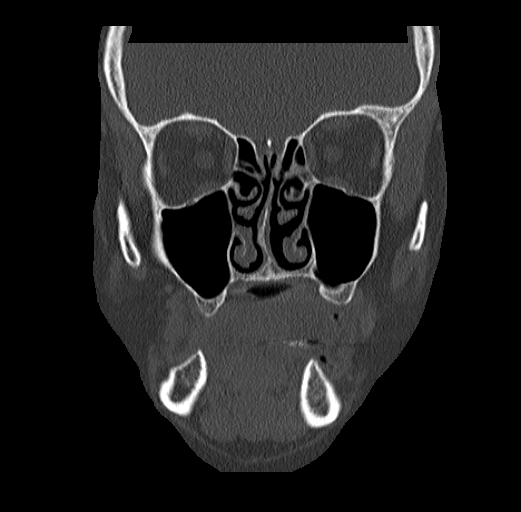
[im 62/104  bone]
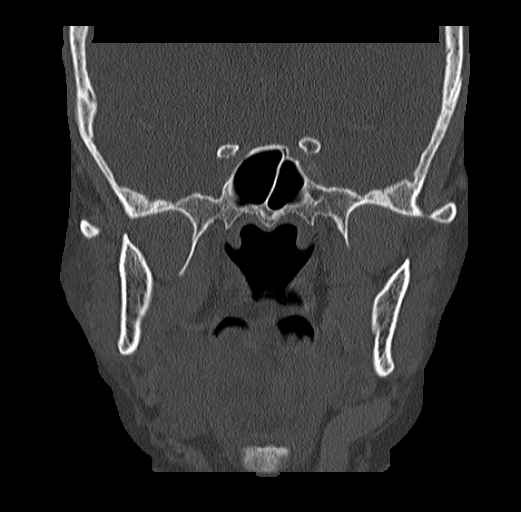

[Series 7: max bone sag · sagittal · 0.31mm/px · 2 of 110 slices shown]
[im 37/110  bone]
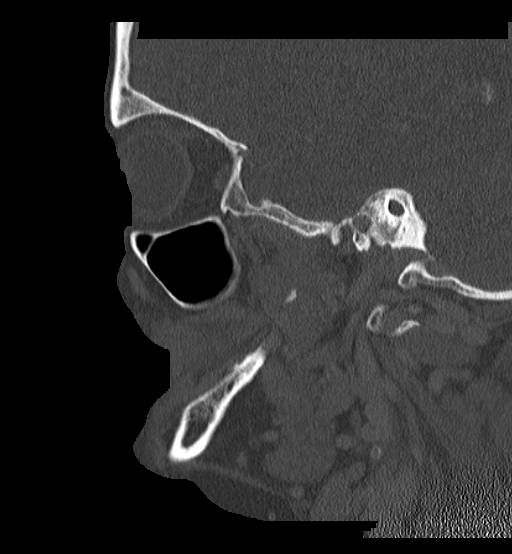
[im 73/110  bone]
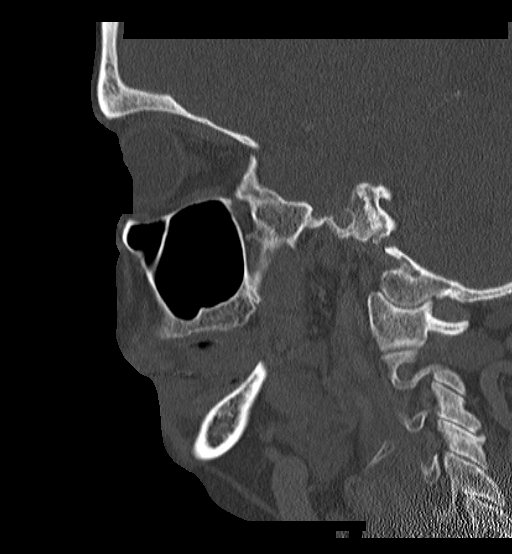

[Series 8: max st axial · axial · 0.35mm/px · z∈[-17,+121]mm · 11 of 81 slices shown, 14 images]
[im 6/81  brain]
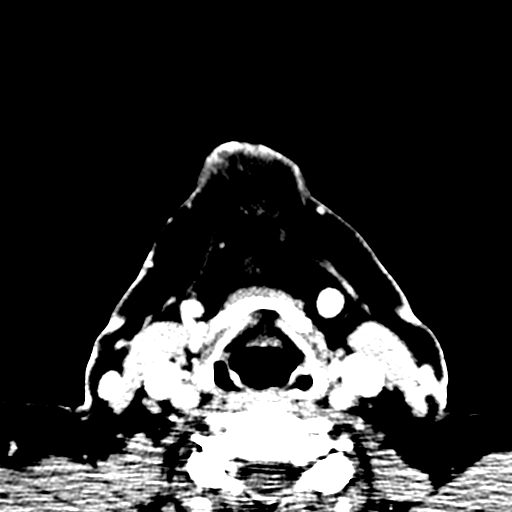
[im 6/81  bone]
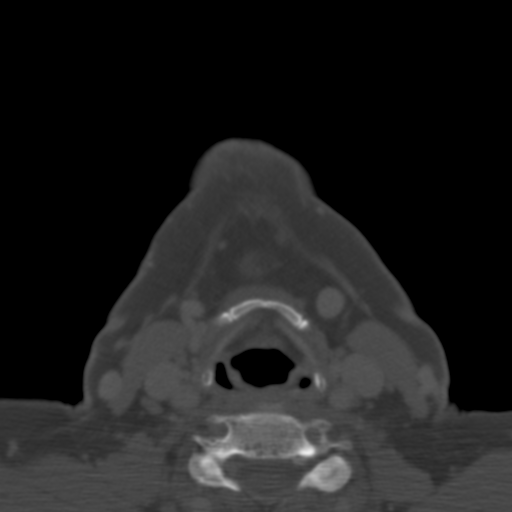
[im 12/81  bone]
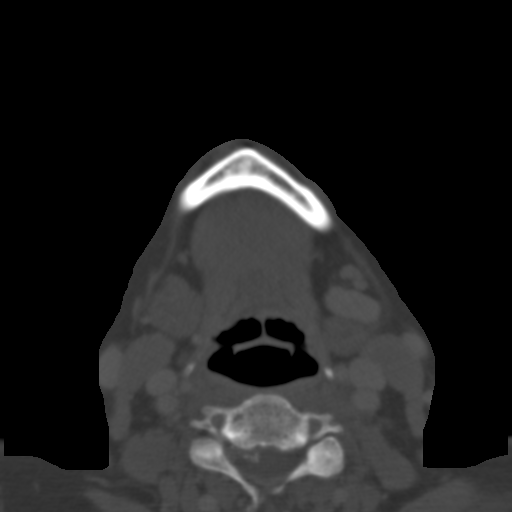
[im 20/81  bone]
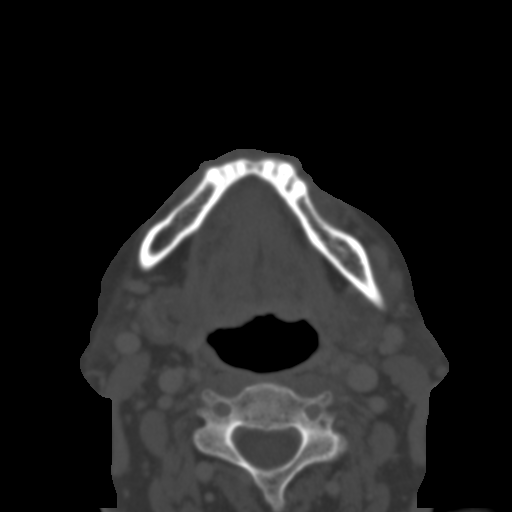
[im 25/81  bone]
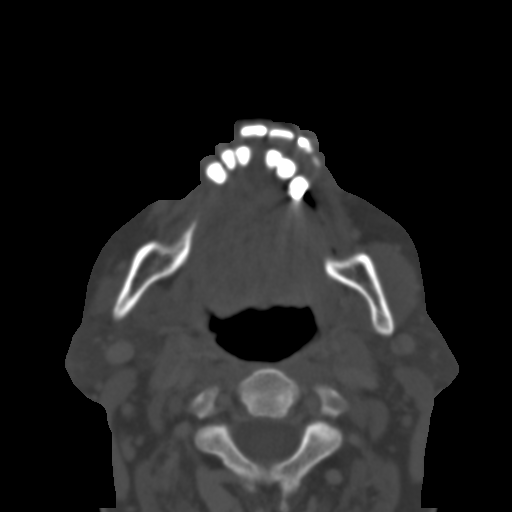
[im 34/81  brain]
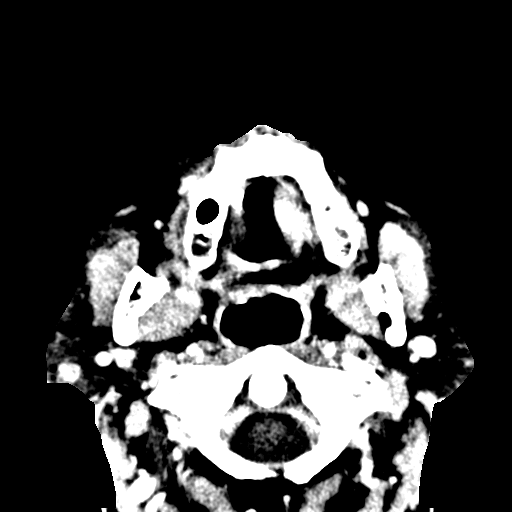
[im 34/81  bone]
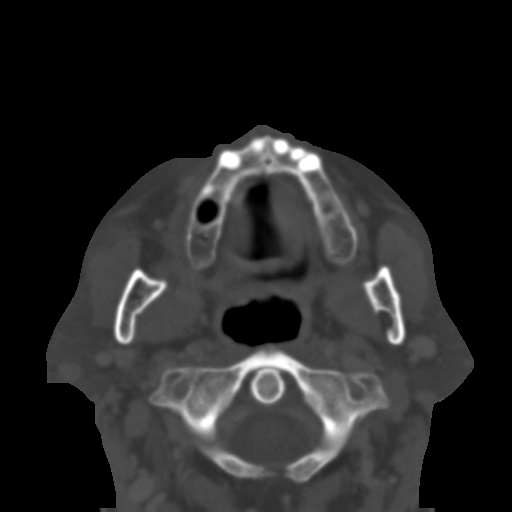
[im 42/81  bone]
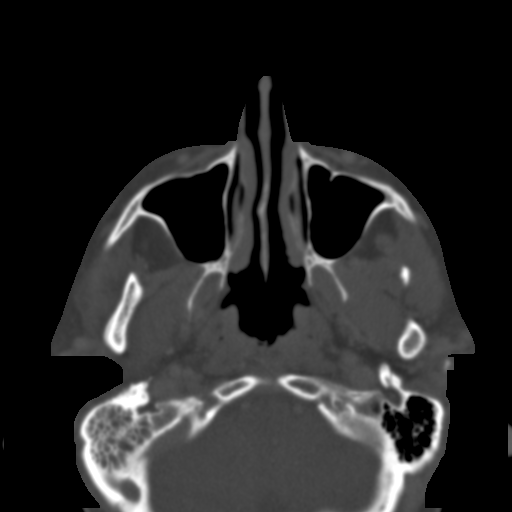
[im 47/81  bone]
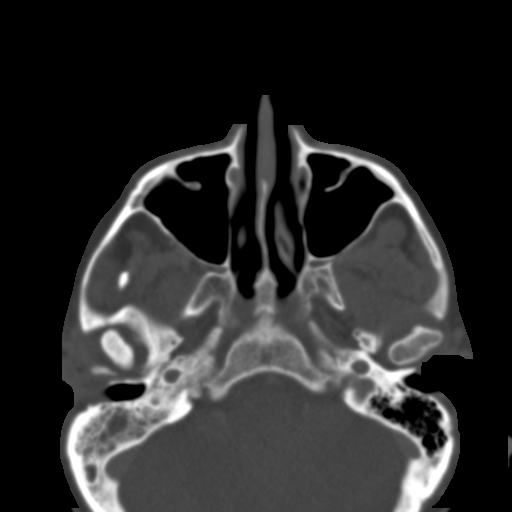
[im 56/81  bone]
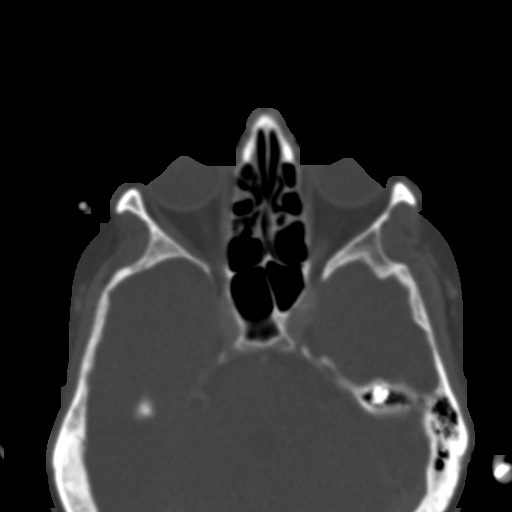
[im 61/81  brain]
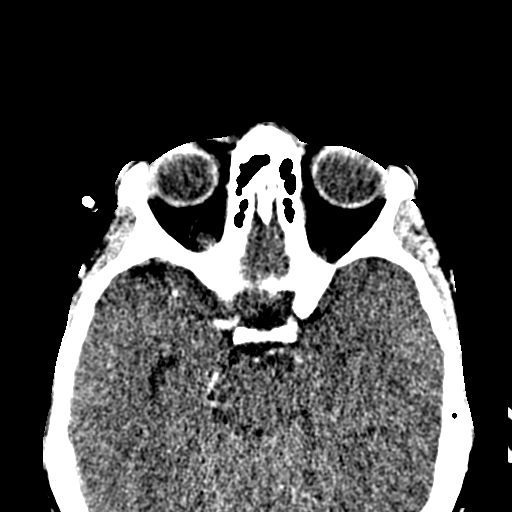
[im 61/81  bone]
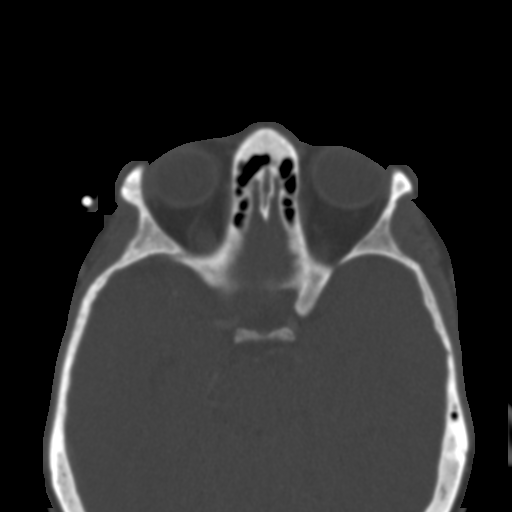
[im 69/81  bone]
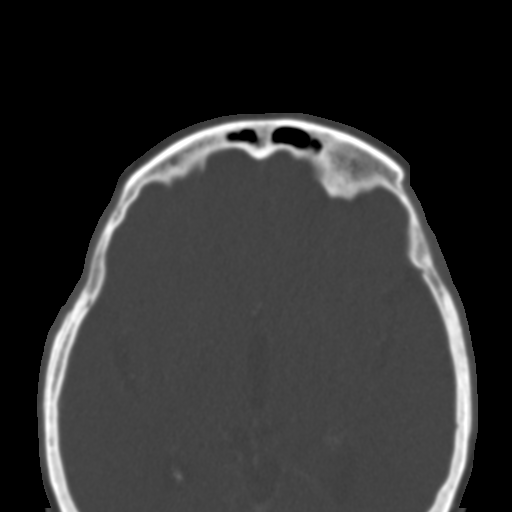
[im 75/81  bone]
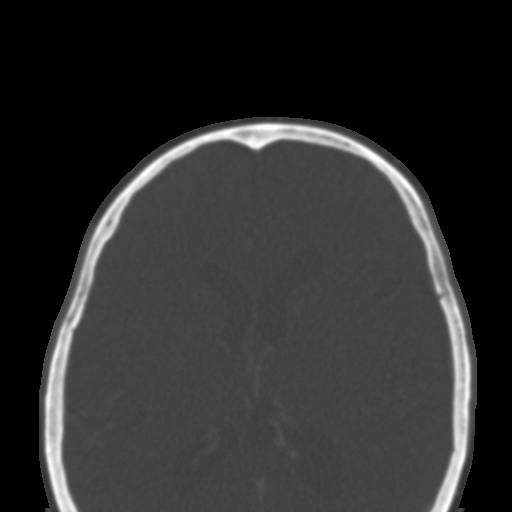

[16 of 47 positions shown; findings below may reference images not displayed]

FINDINGS: The visualized portion of the brain is unremarkable. Orbits are
unremarkable. Paranasal sinuses and left mastoid air cells are
clear. There is a large right mastoid effusion without evidence of
coalescence. There is partial opacification of the right middle ear
by fluid/soft tissue. No nasopharyngeal mass is seen.

Temporomandibular joints are located. Deficiency of the right
mandibular condyle is consistent with interval condylectomy. There
is essentially complete loss of the right TMJ joint space with
bone-on-bone articulation. There is marked osseous thinning of the
roof of the right TMJ, although frank osseous dehiscence is not
clearly identified. 1.6 cm low-density collection extending
anteroinferiorly from the joint along the lateral pterygoid muscle
is consistent with a joint effusion. Asymmetric enhancing soft
tissue lateral to the right TMJ likely reflects postoperative
granulation tissue.
IMPRESSION: 1. Postoperative and marked degenerative changes at the right
temporomandibular joint with complete loss of joint space and marked
osseous thinning of the roof of the TMJ. Joint effusion.
2. Large right mastoid effusion and partial opacification of the
right middle ear.

## 2016-02-20 ENCOUNTER — Encounter: Payer: Self-pay | Admitting: Gastroenterology

## 2016-12-25 ENCOUNTER — Encounter: Payer: Self-pay | Admitting: Orthopaedic Surgery

## 2016-12-25 ENCOUNTER — Ambulatory Visit (INDEPENDENT_AMBULATORY_CARE_PROVIDER_SITE_OTHER): Payer: BLUE CROSS/BLUE SHIELD

## 2016-12-25 ENCOUNTER — Ambulatory Visit (INDEPENDENT_AMBULATORY_CARE_PROVIDER_SITE_OTHER): Payer: BLUE CROSS/BLUE SHIELD | Admitting: Orthopaedic Surgery

## 2016-12-25 VITALS — BP 128/79 | HR 76 | Temp 98.5°F | Ht 62.5 in | Wt 208.0 lb

## 2016-12-25 DIAGNOSIS — M25512 Pain in left shoulder: Secondary | ICD-10-CM | POA: Diagnosis not present

## 2016-12-25 MED ORDER — NAPROXEN 500 MG PO TABS: 500.0000 mg | ORAL_TABLET | Freq: Two times a day (BID) | ORAL | 5 refills | Status: DC
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
## 2017-01-08 ENCOUNTER — Ambulatory Visit: Payer: BLUE CROSS/BLUE SHIELD | Admitting: Orthopaedic Surgery

## 2017-04-10 ENCOUNTER — Other Ambulatory Visit: Payer: Self-pay

## 2017-04-10 ENCOUNTER — Encounter: Payer: Self-pay | Admitting: *Deleted

## 2017-04-10 ENCOUNTER — Encounter: Payer: Self-pay | Admitting: Gastroenterology

## 2017-04-10 ENCOUNTER — Telehealth: Payer: Self-pay | Admitting: *Deleted

## 2017-04-10 ENCOUNTER — Other Ambulatory Visit: Payer: Self-pay | Admitting: *Deleted

## 2017-04-10 ENCOUNTER — Ambulatory Visit: Payer: BLUE CROSS/BLUE SHIELD | Admitting: Gastroenterology

## 2017-04-10 DIAGNOSIS — R197 Diarrhea, unspecified: Secondary | ICD-10-CM | POA: Diagnosis not present

## 2017-04-10 NOTE — Progress Notes (Signed)
ON RECALL  °

## 2017-04-10 NOTE — Assessment & Plan Note (Signed)
SYMPTOMS NOT CONTROLLED AFTER 3 WEEKS AND ONE COURSE OF CIPRO x 10 DAYS. C DIFF NEG. DIFFERENTIAL DIAGNOSIS INCLUDES: LACTOSE INTOLERANCE, MICROSCOPIC COLITIS. LESS LIKELY THYROID DISTURBANCE, OR IBD.  DRINK WATER TO KEEP YOUR URINE LIGHT YELLOW. Follow a LACTOSE FREE DIET.  HANDOUT GIVEN. IF YOU CONSUME DAIRY, ADD LACTASE 2-3 PILLS WITH MEALS UP TO THREE TIMES A DAY AND WITH SNACKS. PLEASE SUBMIT A URINE SAMPLE. USE LOMOTIL IF NEEDED TO CONTROL STOOLS. COMPLETE COLONOSCOPY.  FOLLOW UP IN 4 MOS.

## 2017-04-10 NOTE — Patient Instructions (Addendum)
DRINK WATER TO KEEP YOUR URINE LIGHT YELLOW.  Follow a LACTOSE FREE DIET. SEE INFO BELOW  IF YOU CONSUME DAIRY, ADD LACTASE 2-3 PILLS WITH MEALS UP TO THREE TIMES A DAY AND WITH SNACKS.  PLEASE SUBMIT A URINE SAMPLE.  USE LOMOTIL IF NEEDED TO CONTROL STOOLS.  COMPLETE COLONOSCOPY.  FOLLOW UP IN 4 MOS.    Lactose Free Diet Lactose is a carbohydrate that is found mainly in milk and milk products, as well as in foods with added milk or whey. Lactose must be digested by the enzyme in order to be used by the body. Lactose intolerance occurs when there is a shortage of lactase. When your body is not able to digest lactose, you may feel sick to your stomach (nausea), bloating, cramping, gas and diarrhea.  There are many dairy products that may be tolerated better than milk by some people:  The use of cultured dairy products such as yogurt, buttermilk, cottage cheese, and sweet acidophilus milk (Kefir) for lactase-deficient individuals is usually well tolerated. This is because the healthy bacteria help digest lactose.   Lactose-hydrolyzed milk (Lactaid) contains 40-90% less lactose than milk and may also be well tolerated.    SPECIAL NOTES  Lactose is a carbohydrates. The major food source is dairy products. Reading food labels is important. Many products contain lactose even when they are not made from milk. Look for the following words: whey, milk solids, dry milk solids, nonfat dry milk powder. Typical sources of lactose other than dairy products include breads, candies, cold cuts, prepared and processed foods, and commercial sauces and gravies.   All foods must be prepared without milk, cream, or other dairy foods.   Soy milk and lactose-free supplements (LACTASE) may be used as an alternative to milk.   FOOD GROUP ALLOWED/RECOMMENDED AVOID/USE SPARINGLY  BREADS / STARCHES 4 servings or more* Breads and rolls made without milk. [place], [place], or [place] bread. Breads and rolls that  contain milk. Prepared mixes such as muffins, biscuits, waffles, pancakes. Sweet rolls, donuts, [place] toast (if made with milk or lactose).  Crackers: Soda crackers, graham crackers. Any crackers prepared without lactose. Zwieback crackers, corn curls, or any that contain lactose.  Cereals: Cooked or dry cereals prepared without lactose (read labels). Cooked or dry cereals prepared with lactose (read labels). Total, Cocoa Krispies. Special K.  Potatoes / Pasta / Rice: Any prepared without milk or lactose. Popcorn. Instant potatoes, frozen [place] fries, scalloped or au gratin potatoes.  VEGETABLES 2 servings or more Fresh, frozen, and canned vegetables. Creamed or breaded vegetables. Vegetables in a cheese sauce or with lactose-containing margarines.  FRUIT 2 servings or more All fresh, canned, or frozen fruits that are not processed with lactose. Any canned or frozen fruits processed with lactose.  MEAT & SUBSTITUTES 2 servings or more (4 to 6 oz. total per day) Plain beef, chicken, fish, [place], lamb, veal, pork, or ham. Kosher prepared meat products. Strained or junior meats that do not contain milk. Eggs, soy meat substitutes, nuts. Scrambled eggs, omelets, and souffles that contain milk. Creamed or breaded meat, fish, or fowl. Sausage products such as wieners, liver sausage, or cold cuts that contain milk solids. Cheese, cottage cheese, or cheese spreads.  MILK None. (See "BEVERAGES" for milk substitutes. See "DESSERTS" for ice cream and frozen desserts.) Milk (whole, 2%, skim, or chocolate). Evaporated, powdered, or condensed milk; malted milk.  SOUPS & COMBINATION FOODS Bouillon, broth, vegetable soups, clear soups, consomms. Homemade soups made with allowed ingredients. Combination  or prepared foods that do not contain milk or milk products (read labels). Cream soups, chowders, commercially prepared soups containing lactose. Macaroni and cheese, pizza. Combination or prepared foods that  contain milk or milk products.  DESSERTS & SWEETS In moderation Water and fruit ices; gelatin; angel food cake. Homemade cookies, pies, or cakes made from allowed ingredients. Pudding (if made with water or a milk substitute). Lactose-free tofu desserts. Sugar, honey, corn syrup, jam, jelly; marmalade; molasses (beet sugar); Pure sugar candy; marshmallows. Ice cream, ice milk, sherbet, custard, pudding, frozen yogurt. Commercial cake and cookie mixes. Desserts that contain chocolate. Pie crust made with milk-containing margarine; reduced-calorie desserts made with a sugar substitute that contains lactose. Toffee, peppermint, butterscotch, chocolate, caramels.  FATS & OILS In moderation Butter (as tolerated; contains very small amounts of lactose). Margarines and dressings that do not contain milk, Vegetable oils, shortening, Miracle Whip, mayonnaise, nondairy cream & whipped toppings without lactose or milk solids added (examples: Coffee Rich, Carnation Coffeemate, Rich's Whipped Topping, PolyRich). Berniece Salines. Margarines and salad dressings containing milk; cream, cream cheese; peanut butter with added milk solids, sour cream, chip dips, made with sour cream.  BEVERAGES Carbonated drinks; tea; coffee and freeze-dried coffee; some instant coffees (check labels). Fruit drinks; fruit and vegetable juice; Rice or Soy milk. Ovaltine, hot chocolate. Some cocoas; some instant coffees; instant iced teas; powdered fruit drinks (read labels).   CONDIMENTS / MISCELLANEOUS Soy sauce, carob powder, olives, gravy made with water, baker's cocoa, pickles, pure seasonings and spices, wine, pure monosodium glutamate, catsup, mustard. Some chewing gums, chocolate, some cocoas. Certain antibiotics and vitamin / mineral preparations. Spice blends if they contain milk products. MSG extender. Artificial sweeteners that contain lactose such as Equal (Nutra-Sweet) and Sweet 'n Low. Some nondairy creamers (read labels).   SAMPLE  MENU*  Breakfast   Orange Juice.  Banana.   Bran flakes.   Nondairy Creamer.  [place] Bread (toasted).   Butter or milk-free margarine.   Coffee or tea.    Noon Meal   Chicken Breast.  Rice.   Green beans.   Butter or milk-free margarine.  Fresh melon.   Coffee or tea.    Evening Meal   Roast Beef.  Baked potato.   Butter or milk-free margarine.   Broccoli.   Lettuce salad with vinegar and oil dressing.  [employer]   Coffee or tea.

## 2017-04-10 NOTE — Progress Notes (Signed)
cc'ed to pcp °

## 2017-04-10 NOTE — Telephone Encounter (Signed)
Called spoke with pt spouse and TCS is rescheduled to 04/29/17 at 3:00pm. New prep instructions has been mailed to patient with updated dates/times. Called Hoyle Sauer and made aware of change as well.
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
## 2017-04-10 NOTE — H&P (View-Only) (Signed)
Subjective:    Patient ID: Brenda Love, female    DOB: [birth year], 62 y.o.   MRN: [id number]  HPI CHANGE IN BOWEL HABITS. 3 WEEKS AGO DEVELOPED PROFUSUE WATERY DIARRHEA.  SYMPTOMS: NOT AS BAD BUT STILL HAS IT. AT FIRST: TNTC STOOLS AND NOW 6-10/DAY. LOMOTIL HELPS AND KEEPS FROM HAVING ACCIDENTS AT NIGHT. TRAVEL: NO WELL WATER: NO. ABX: NO. NO URINE SAMPLE. C DIFF NEG. Problems with sedation: MAKES HER SICK. WEIGHT DOWN FROM 223 LBS IN 2013 TO 213 IN 2019.  LOWER ABDOMINAL PAIN: BILATERAL LOWER. PAIN AND DIARRHEA STARTED AT THE SAME TIME. SHARP, NO RADIATION, ASSOCIATED WITH LOWER BACK /POS SACRAL PAIN. MILK: CEREAL, CHEESE: LIKES IT, ICE CREAM: LOVES IT.    PT DENIES FEVER, CHILLS, HEMATOCHEZIA, HEMATEMESIS, nausea, vomiting, melena, CHEST PAIN, SHORTNESS OF BREATH,  DYSURIA, OR HEMATURIA,  JOINT SWELLING, constipation, problems swallowing, heartburn or indigestion.  Past Medical History:  Diagnosis Date  . Colon polyps    Dr. Rowe Pavy  . Complication of anesthesia    woke up during procedure  . GERD (gastroesophageal reflux disease)   . H/O hiatal hernia   . Hard of hearing   . HOH (hard of hearing)    hearing aids  . Hypercholesteremia   . Hypertension   . Myofascial pain on right side 05/10/2014  . Obesity   . Pneumonia   . PONV (postoperative nausea and vomiting)   . S/P colonoscopy 10/01/2003   Dr Levert Feinstein  . TMJ syndrome   . Tremor 05/10/2014   Past Surgical History:  Procedure Laterality Date  . ARTHROTOMY Right 12/28/2012   Procedure: Temporomandibular Joint Arthrotomy; Meniscectomy;  Surgeon: Gae Bon, DDS;  Location: [place];  Service: Oral Surgery;  Laterality: Right;  . ARTHROTOMY Right 07/09/2013   Procedure: ARTHROTOMY CONDYLECTOMY,TMJ disc fragment removal  AND TEMPORALIS GRAFT;  Surgeon: Gae Bon, DDS;  Location: [place];  Service: Oral Surgery;  Laterality: Right;  . BREAST SURGERY     Hx: of left lumpectomy  . CHOLECYSTECTOMY    .  ESOPHAGOGASTRODUODENOSCOPY  03/13/2011   Moderate gastritis in the antrum/SMALL Hiatal hernia  . ILEOColonoscopy  03/13/2011   MILD Diverticulosis in the sigmoid to descending colon/ Internal hemorrhoids/POLYPOID LESIONS IN COLON IC VALVE LESION MOST LIKELY A HEALING ULCER FROM NSAID INJURY HEME POS STOOLS 2o TO HEMORRHOIDS, NSAID COLOPATHY/GASTRITIS  . TEMPOROMANDIBULAR JOINT SURGERY    . TONSILLECTOMY    . VAGINAL HYSTERECTOMY    . VAGINAL PROLAPSE REPAIR      Allergies  Allergen Reactions  . Codeine Hives and Nausea And Vomiting  . Sulfa Antibiotics Nausea And Vomiting   Current Outpatient Medications  Medication Sig Dispense Refill  . cyclobenzaprine (FLEXERIL) 10 MG tablet Take 1 tablet (10 mg total) by mouth 3 (three) times daily as needed for muscle spasms. (Patient taking differently: Take 10 mg by mouth at bedtime. )    . diphenoxylate-atropine (LOMOTIL) 2.5-0.025 MG tablet Take 1 tablet by mouth as needed.     . metoprolol (LOPRESSOR) 50 MG tablet Take 25-50 mg by mouth 2 (two) times daily. Takes 1 tablet in am and 1/2 tablet in pm    . Multiple Vitamin (MULITIVITAMIN WITH MINERALS) TABS Take 1 tablet by mouth every morning.      . naproxen (NAPROSYN) 500 MG tablet Take 1 tablet (500 mg total) by mouth 2 (two) times daily with a meal.    . Omega-3 Fatty Acids (FISH OIL PEARLS PO) Take 1 capsule by mouth  daily.    . omeprazole (PRILOSEC) 20 MG capsule Take 1 capsule (20 mg total) by mouth daily.    . pravastatin (PRAVACHOL) 10 MG tablet Take 10 mg by mouth daily.    . baclofen (LIORESAL) 10 MG tablet Take 10 mg by mouth 2 (two) times daily.    . traMADol (ULTRAM) 50 MG tablet Take 50 mg by mouth. Take one tab twice a day as needed.     Family History  Problem Relation Age of Onset  . Colon cancer Mother 62  . Hypertension Mother   . Diabetes type I Mother   . Cancer Mother   . Diabetes Sister   . Coronary artery disease Father   . Emphysema Father   . Colon polyps Neg Hx      Social History   Socioeconomic History  . Marital status: Married    Spouse name: None  . Number of children: 3  . Years of education: None  . Highest education level: None  Social Needs  . Financial resource strain: None  . Food insecurity - worry: None  . Food insecurity - inability: None  . Transportation needs - medical: None  . Transportation needs - non-medical: None  Occupational History  . Occupation: [employer]  Tobacco Use  . Smoking status: Former Smoker    Packs/day: 1.00    Years: 5.00    Pack years: 5.00    Types: Cigarettes    Last attempt to quit: 02/27/1994    Years since quitting: 23.1  . Smokeless tobacco: Never Used  Substance and Sexual Activity  . Alcohol use: No  . Drug use: No  . Sexual activity: No  Other Topics Concern  . None  Social History Narrative   Lost 1 son-bad accident   Oldest son in upper level apt   Patient is right handed.   Patient drinks about 2 cups caffeine daily.   Worked AT [employer]. 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 MARRIED: 22 YRS(JAN 2019)   Review of Systems PER HPI OTHERWISE ALL SYSTEMS ARE NEGATIVE.    Objective:   Physical Exam  Constitutional: She is oriented to person, place, and time. She appears well-developed and well-nourished. No distress.  HENT:  Head: Normocephalic and atraumatic.  Mouth/Throat: Oropharynx is clear and moist. No oropharyngeal exudate.  BIT BLOCK IN PACE ON UPPER TEETH  Eyes: Pupils are equal, round, and reactive to light. No scleral icterus.  Neck: Normal range of motion. Neck supple.  Cardiovascular: Normal rate, regular rhythm and normal heart sounds.  Pulmonary/Chest: Effort normal and breath sounds normal. No respiratory distress.  Abdominal: Soft. Bowel sounds are normal. She exhibits no distension. There is no tenderness.  Musculoskeletal: She exhibits no edema.  Lymphadenopathy:    She has no cervical adenopathy.  Neurological: She is alert and oriented to person, place, and  time.  HARD OF HEARING, NO  NEW FOCAL DEFICITS  Psychiatric: She has a normal mood and affect.  Vitals reviewed.     Assessment & Plan:

## 2017-04-11 LAB — URINALYSIS
BILIRUBIN UA: NEGATIVE
Glucose, UA: NEGATIVE
Ketones, UA: NEGATIVE
NITRITE UA: NEGATIVE
Protein, UA: NEGATIVE
RBC, UA: NEGATIVE
Specific Gravity, UA: 1.011 (ref 1.005–1.030)
UUROB: 0.2 mg/dL (ref 0.2–1.0)
pH, UA: 5.5 (ref 5.0–7.5)

## 2017-04-11 NOTE — Progress Notes (Signed)
LMOM to call.

## 2017-04-14 NOTE — Progress Notes (Signed)
LMOM to call. Letter mailed with results.

## 2017-04-28 ENCOUNTER — Telehealth: Payer: Self-pay

## 2017-04-28 ENCOUNTER — Telehealth: Payer: Self-pay | Admitting: Gastroenterology

## 2017-04-28 NOTE — Telephone Encounter (Signed)
Had cancellation on schedule for tomorrow. Tried to call pt to see if she could have tcs earlier, no answer, LMOVM for her to call office.

## 2017-04-28 NOTE — Telephone Encounter (Signed)
Pt's husband called office (she is deaf, he will be taking her for tcs tomorrow). She can arrive at 10:45am tomorrow for 10:45am TCS. Advised him to let her know to start 2nd bottle of prep at 6:45am and NPO after 8:45am. LMOVM and informed Endo scheduler.

## 2017-04-28 NOTE — Telephone Encounter (Signed)
PATIENT HUSBAND RETURNED CALL, PLEASE CALL BACK

## 2017-04-29 ENCOUNTER — Encounter (HOSPITAL_COMMUNITY)
Admission: RE | Disposition: A | Discharge status: Home / Self Care | Payer: Self-pay | Source: Ambulatory Visit | Attending: Gastroenterology

## 2017-04-29 ENCOUNTER — Other Ambulatory Visit: Payer: Self-pay

## 2017-04-29 ENCOUNTER — Ambulatory Visit (HOSPITAL_COMMUNITY)
Admission: RE | Admit: 2017-04-29 | Discharge: 2017-04-29 | Disposition: A | Discharge status: Home / Self Care | Payer: BLUE CROSS/BLUE SHIELD | Source: Ambulatory Visit | Attending: Gastroenterology | Admitting: Gastroenterology

## 2017-04-29 ENCOUNTER — Encounter (HOSPITAL_COMMUNITY): Payer: Self-pay

## 2017-04-29 DIAGNOSIS — K644 Residual hemorrhoidal skin tags: Secondary | ICD-10-CM | POA: Insufficient documentation

## 2017-04-29 DIAGNOSIS — Z882 Allergy status to sulfonamides status: Secondary | ICD-10-CM | POA: Diagnosis not present

## 2017-04-29 DIAGNOSIS — E7800 Pure hypercholesterolemia, unspecified: Secondary | ICD-10-CM | POA: Diagnosis not present

## 2017-04-29 DIAGNOSIS — I10 Essential (primary) hypertension: Secondary | ICD-10-CM | POA: Diagnosis not present

## 2017-04-29 DIAGNOSIS — K529 Noninfective gastroenteritis and colitis, unspecified: Secondary | ICD-10-CM | POA: Insufficient documentation

## 2017-04-29 DIAGNOSIS — Z8719 Personal history of other diseases of the digestive system: Secondary | ICD-10-CM | POA: Insufficient documentation

## 2017-04-29 DIAGNOSIS — K219 Gastro-esophageal reflux disease without esophagitis: Secondary | ICD-10-CM | POA: Insufficient documentation

## 2017-04-29 DIAGNOSIS — Z86010 Personal history of colonic polyps: Secondary | ICD-10-CM | POA: Insufficient documentation

## 2017-04-29 DIAGNOSIS — Z87891 Personal history of nicotine dependence: Secondary | ICD-10-CM | POA: Insufficient documentation

## 2017-04-29 DIAGNOSIS — R197 Diarrhea, unspecified: Secondary | ICD-10-CM

## 2017-04-29 DIAGNOSIS — Z8371 Family history of colonic polyps: Secondary | ICD-10-CM | POA: Insufficient documentation

## 2017-04-29 DIAGNOSIS — Z79899 Other long term (current) drug therapy: Secondary | ICD-10-CM | POA: Diagnosis not present

## 2017-04-29 DIAGNOSIS — D125 Benign neoplasm of sigmoid colon: Secondary | ICD-10-CM | POA: Diagnosis not present

## 2017-04-29 DIAGNOSIS — Z885 Allergy status to narcotic agent status: Secondary | ICD-10-CM | POA: Insufficient documentation

## 2017-04-29 DIAGNOSIS — Q438 Other specified congenital malformations of intestine: Secondary | ICD-10-CM | POA: Diagnosis not present

## 2017-04-29 HISTORY — PX: BIOPSY: SHX5522

## 2017-04-29 HISTORY — PX: POLYPECTOMY: SHX5525

## 2017-04-29 HISTORY — PX: COLONOSCOPY: SHX5424

## 2017-04-29 SURGERY — COLONOSCOPY
Anesthesia: Moderate Sedation

## 2017-04-29 MED ORDER — STERILE WATER FOR IRRIGATION IR SOLN
Status: DC | PRN
  Administered 2017-04-29: 15 mL

## 2017-04-29 MED ORDER — MIDAZOLAM HCL 5 MG/5ML IJ SOLN
INTRAMUSCULAR | Status: AC
  Filled 2017-04-29: qty 10

## 2017-04-29 MED ORDER — MIDAZOLAM HCL 5 MG/5ML IJ SOLN
INTRAMUSCULAR | Status: DC | PRN
  Administered 2017-04-29 (×2): 1 mg via INTRAVENOUS
  Administered 2017-04-29 (×2): 2 mg via INTRAVENOUS

## 2017-04-29 MED ORDER — MEPERIDINE HCL 100 MG/ML IJ SOLN
INTRAMUSCULAR | Status: DC | PRN
  Administered 2017-04-29: 50 mg via INTRAVENOUS
  Administered 2017-04-29 (×2): 25 mg via INTRAVENOUS

## 2017-04-29 MED ORDER — MEPERIDINE HCL 100 MG/ML IJ SOLN
INTRAMUSCULAR | Status: AC
  Filled 2017-04-29: qty 2

## 2017-04-29 MED ORDER — SODIUM CHLORIDE 0.9 % IV SOLN: INTRAVENOUS | Status: DC

## 2017-04-29 NOTE — Interval H&P Note (Signed)
History and Physical Interval Note:  04/29/2017 11:31 AM  Brenda Love  has presented today for surgery, with the diagnosis of DIARRHEA  The various methods of treatment have been discussed with the patient and family. After consideration of risks, benefits and other options for treatment, the patient has consented to  Procedure(s) with comments: COLONOSCOPY (N/A) - 3:00 as a surgical intervention .  The patient's history has been reviewed, patient examined, no change in status, stable for surgery.  I have reviewed the patient's chart and labs.  Questions were answered to the patient's satisfaction.     [employer]

## 2017-04-29 NOTE — Discharge Instructions (Signed)
THE LAST PART OF YOUR SMALL BOWEL IS NORMAL. NO SOURCE FOR HER DIARRHEA WAS IDENTIFIED. You had 1 small polyp removed. You have LARGE EXTERNAL hemorrhoids and diverticulosis IN YOUR LEFT COLON. I BIOPSIED YOUR COLON.   DRINK WATER TO KEEP YOUR URINE LIGHT YELLOW.  Follow a LACTOSE FREE/HIGH FIBER DIET. SEE INFO BELOW ON A HIGH FIBER DIET.  IF YOU CONSUME DAIRY, ADD LACTASE 2-3 PILLS WITH MEALS UP TO THREE TIMES A DAY AND WITH SNACKS.  USE LOMOTIL IF NEEDED TO CONTROL STOOLS.  FOLLOW UP IN 3 MOS.   Next colonoscopy in 5-10 years.  Colonoscopy Care After Read the instructions outlined below and refer to this sheet in the next week. These discharge instructions provide you with general information on caring for yourself after you leave the hospital. While your treatment has been planned according to the most current medical practices available, unavoidable complications occasionally occur. If you have any problems or questions after discharge, call DR. FIELDS, [phone].  ACTIVITY  You may resume your regular activity, but move at a slower pace for the next 24 hours.   Take frequent rest periods for the next 24 hours.   Walking will help get rid of the air and reduce the bloated feeling in your belly (abdomen).   No driving for 24 hours (because of the medicine (anesthesia) used during the test).   You may shower.   Do not sign any important legal documents or operate any machinery for 24 hours (because of the anesthesia used during the test).    NUTRITION  Drink plenty of fluids.   You may resume your normal diet as instructed by your doctor.   Begin with a light meal and progress to your normal diet. Heavy or fried foods are harder to digest and may make you feel sick to your stomach (nauseated).   Avoid alcoholic beverages for 24 hours or as instructed.    MEDICATIONS  You may resume your normal medications.   WHAT YOU CAN EXPECT TODAY  Some feelings of  bloating in the abdomen.   Passage of more gas than usual.   Spotting of blood in your stool or on the toilet paper  .  IF YOU HAD POLYPS REMOVED DURING THE COLONOSCOPY:  Eat a soft diet IF YOU HAVE NAUSEA, BLOATING, ABDOMINAL PAIN, OR VOMITING.    FINDING OUT THE RESULTS OF YOUR TEST Not all test results are available during your visit. DR. Oneida Alar WILL CALL YOU WITHIN 14 DAYS OF YOUR PROCEDUE WITH YOUR RESULTS. Do not assume everything is normal if you have not heard from DR. FIELDS, CALL HER OFFICE AT [phone].  SEEK IMMEDIATE MEDICAL ATTENTION AND CALL THE OFFICE: [phone] IF:  You have more than a spotting of blood in your stool.   Your belly is swollen (abdominal distention).   You are nauseated or vomiting.   You have a temperature over 101F.   You have abdominal pain or discomfort that is severe or gets worse throughout the day.  Polyps, Colon  A polyp is extra tissue that grows inside your body. Colon polyps grow in the large intestine. The large intestine, also called the colon, is part of your digestive system. It is a long, hollow tube at the end of your digestive tract where your body makes and stores stool. Most polyps are not dangerous. They are benign. This means they are not cancerous. But over time, some types of polyps can turn into cancer. Polyps that are smaller than  a pea are usually not harmful. But larger polyps could someday become or may already be cancerous. To be safe, doctors remove all polyps and test them.   WHO GETS POLYPS? Anyone can get polyps, but certain people are more likely than others. You may have a greater chance of getting polyps if:  You are over 50.   You have had polyps before.   Someone in your family has had polyps.   Someone in your family has had cancer of the large intestine.   Find out if someone in your family has had polyps. You may also be more likely to get polyps if you:   Eat a lot of fatty foods   Smoke    Drink alcohol   Do not exercise  Eat too much     PREVENTION There is not one sure way to prevent polyps. You might be able to lower your risk of getting them if you:  Eat more fruits and vegetables and less fatty food.   Do not smoke.   Avoid alcohol.   Exercise every day.   Lose weight if you are overweight.   Eating more calcium and folate can also lower your risk of getting polyps. Some foods that are rich in calcium are milk, cheese, and broccoli. Some foods that are rich in folate are chickpeas, kidney beans, and spinach.   High-Fiber Diet A high-fiber diet changes your normal diet to include more whole grains, legumes, fruits, and vegetables. Changes in the diet involve replacing refined carbohydrates with unrefined foods. The calorie level of the diet is essentially unchanged. The Dietary Reference Intake (recommended amount) for adult males is 38 grams per day. For adult females, it is 25 grams per day. Pregnant and lactating women should consume 28 grams of fiber per day. Fiber is the intact part of a plant that is not broken down during digestion. Functional fiber is fiber that has been isolated from the plant to provide a beneficial effect in the body. PURPOSE  Increase stool bulk.   Ease and regulate bowel movements.   Lower cholesterol.   REDUCE RISK OF COLON CANCER  INDICATIONS THAT YOU NEED MORE FIBER  Constipation and hemorrhoids.   Uncomplicated diverticulosis (intestine condition) and irritable bowel syndrome.   Weight management.   As a protective measure against hardening of the arteries (atherosclerosis), diabetes, and cancer.   GUIDELINES FOR INCREASING FIBER IN THE DIET  Start adding fiber to the diet slowly. A gradual increase of about 5 more grams (2 slices of whole-wheat bread, 2 servings of most fruits or vegetables, or 1 bowl of high-fiber cereal) per day is best. Too rapid an increase in fiber may result in constipation, flatulence, and  bloating.   Drink enough water and fluids to keep your urine clear or pale yellow. Water, juice, or caffeine-free drinks are recommended. Not drinking enough fluid may cause constipation.   Eat a variety of high-fiber foods rather than one type of fiber.   Try to increase your intake of fiber through using high-fiber foods rather than fiber pills or supplements that contain small amounts of fiber.   The goal is to change the types of food eaten. Do not supplement your present diet with high-fiber foods, but replace foods in your present diet.  INCLUDE A VARIETY OF FIBER SOURCES  Replace refined and processed grains with whole grains, canned fruits with fresh fruits, and incorporate other fiber sources. White rice, white breads, and most bakery goods contain little  or no fiber.   Brown whole-grain rice, buckwheat oats, and many fruits and vegetables are all good sources of fiber. These include: broccoli, Brussels sprouts, cabbage, cauliflower, beets, sweet potatoes, white potatoes (skin on), carrots, tomatoes, eggplant, squash, berries, fresh fruits, and dried fruits.   Cereals appear to be the richest source of fiber. Cereal fiber is found in whole grains and bran. Bran is the fiber-rich outer coat of cereal grain, which is largely removed in refining. In whole-grain cereals, the bran remains. In breakfast cereals, the largest amount of fiber is found in those with "bran" in their names. The fiber content is sometimes indicated on the label.   You may need to include additional fruits and vegetables each day.   In baking, for 1 cup white flour, you may use the following substitutions:   1 cup whole-wheat flour minus 2 tablespoons.   1/2 cup white flour plus 1/2 cup whole-wheat flour.   Diverticulosis Diverticulosis is a common condition that develops when small pouches (diverticula) form in the wall of the colon. The risk of diverticulosis increases with age. It happens more often in people  who eat a low-fiber diet. Most individuals with diverticulosis have no symptoms. Those individuals with symptoms usually experience belly (abdominal) pain, constipation, or loose stools (diarrhea).  HOME CARE INSTRUCTIONS  Increase the amount of fiber in your diet as directed by your caregiver or dietician. This may reduce symptoms of diverticulosis.   Drink at least 6 to 8 glasses of water each day to prevent constipation.   Try not to strain when you have a bowel movement.   Avoiding nuts and seeds to prevent complications is still an uncertain benefit.       FOODS HAVING HIGH FIBER CONTENT INCLUDE:  Fruits. Apple, peach, pear, tangerine, raisins, prunes.   Vegetables. Brussels sprouts, asparagus, broccoli, cabbage, carrot, cauliflower, romaine lettuce, spinach, summer squash, tomato, winter squash, zucchini.   Starchy Vegetables. Baked beans, kidney beans, lima beans, split peas, lentils, potatoes (with skin).   Grains. Whole wheat bread, brown rice, bran flake cereal, plain oatmeal, white rice, shredded wheat, bran muffins.    SEEK IMMEDIATE MEDICAL CARE IF:  You develop increasing pain or severe bloating.   You have an oral temperature above 101F.   You develop vomiting or bowel movements that are bloody or black.   Hemorrhoids Hemorrhoids are dilated (enlarged) veins around the rectum. Sometimes clots will form in the veins. This makes them swollen and painful. These are called thrombosed hemorrhoids. Causes of hemorrhoids include:  Constipation.   Straining to have a bowel movement.   HEAVY LIFTING  HOME CARE INSTRUCTIONS  Eat a well balanced diet and drink 6 to 8 glasses of water every day to avoid constipation. You may also use a bulk laxative.   Avoid straining to have bowel movements.   Keep anal area dry and clean.   Do not use a donut shaped pillow or sit on the toilet for long periods. This increases blood pooling and pain.   Move your bowels when  your body has the urge; this will require less straining and will decrease pain and pressure.

## 2017-04-29 NOTE — Telephone Encounter (Signed)
See other phone note

## 2017-04-29 NOTE — Op Note (Signed)
[facility] Patient Name: Brenda Love Procedure Date: 04/29/2017 11:06 AM MRN: [id number] Date of Birth: [birth year] Attending MD: Barney Drain MD, MD CSN: [id number] Age: 62 Admit Type: Outpatient Procedure:                Colonoscopy WITH COLD FORCEPS BIOPSY/POLYPECTOMY Indications:              Clinically significant diarrhea of unexplained                            origin Providers:                Barney Drain MD, MD, Otis Peak B. Sharon Seller, RN, Janeece Riggers, RN, Charlsie Quest. Theda Sers RN, RN, Aram Candela Referring MD:             Glenda Chroman Medicines:                Meperidine 100 mg IV, Midazolam 6 mg IV Complications:            No immediate complications. Estimated Blood Loss:     Estimated blood loss was minimal. Procedure:                Pre-Anesthesia Assessment:                           - Prior to the procedure, a History and Physical                            was performed, and patient medications and                            allergies were reviewed. The patient's tolerance of                            previous anesthesia was also reviewed. The risks                            and benefits of the procedure and the sedation                            options and risks were discussed with the patient.                            All questions were answered, and informed consent                            was obtained. Prior Anticoagulants: The patient has                            taken no previous anticoagulant or antiplatelet                            agents. ASA Grade Assessment: II - A patient with  mild systemic disease. After reviewing the risks                            and benefits, the patient was deemed in                            satisfactory condition to undergo the procedure.                            After obtaining informed consent, the colonoscope                            was passed under  direct vision. Throughout the                            procedure, the patient's blood pressure, pulse, and                            oxygen saturations were monitored continuously. The                            EC-[id number] ([id number]) scope was introduced through                            the anus and advanced to the 10 cm into the ileum.                            The colonoscopy was technically difficult and                            complex due to significant looping. Successful                            completion of the procedure was aided by increasing                            the dose of sedation medication, straightening and                            shortening the scope to obtain bowel loop reduction                            and COLOWRAP. The patient tolerated the procedure                            fairly well. The quality of the bowel preparation                            was good. The terminal ileum, ileocecal valve,                            appendiceal orifice, and rectum were photographed. Scope In: 11:46:53 AM Scope Out: 12:05:49 PM Scope Withdrawal Time: 0 hours 14  minutes 2 seconds  Total Procedure Duration: 0 hours 18 minutes 56 seconds  Findings:      The terminal ileum appeared normal.      A 3 mm polyp was found in the sigmoid colon. The polyp was sessile. The       polyp was removed with a cold biopsy forceps. Resection and retrieval       were complete.      The recto-sigmoid colon, sigmoid colon and descending colon revealed       significantly excessive looping. Biopsies for histology were taken with       a cold forceps from the entire colon for evaluation of microscopic       colitis.      External hemorrhoids were found during retroflexion. The hemorrhoids       were large. Impression:               - The examined portion of the ileum was normal.                           - One 3 mm polyp in the sigmoid colon, removed with                             a cold biopsy forceps. Resected and retrieved.                           - There was significant looping of the colon.                           - External hemorrhoids. Moderate Sedation:      Moderate (conscious) sedation was administered by the endoscopy nurse       and supervised by the endoscopist. The following parameters were       monitored: oxygen saturation, heart rate, blood pressure, and response       to care. Total physician intraservice time was 29 minutes. Recommendation:           - Repeat colonoscopy in 5-10 years for surveillance.                           - High fiber/LACTOSE FREE diet.                           - Await pathology results.                           - Continue present medications.                           - Return to my office in 3 months.                           - Patient has a contact number available for                            emergencies. The signs and symptoms of potential  delayed complications were discussed with the                            patient. Return to normal activities tomorrow.                            Written discharge instructions were provided to the                            patient. Procedure Code(s):        --- Professional ---                           [phone], Colonoscopy, flexible; with biopsy, single                            or multiple                           99152, Moderate sedation services provided by the                            same physician or other qualified health care                            professional performing the diagnostic or                            therapeutic service that the sedation supports,                            requiring the presence of an independent trained                            observer to assist in the monitoring of the                            patient's level of consciousness and physiological                            status; initial  15 minutes of intraservice time,                            patient age 82 years or older                           [phone], Moderate sedation services; each additional                            15 minutes intraservice time Diagnosis Code(s):        --- Professional ---                           K64.4, Residual hemorrhoidal skin tags  D12.5, Benign neoplasm of sigmoid colon                           R19.7, Diarrhea, unspecified CPT copyright 2016 [employer]. All rights reserved. The codes documented in this report are preliminary and upon coder review may  be revised to meet current compliance requirements. Barney Drain, MD Barney Drain MD, MD 04/29/2017 12:15:24 PM This report has been signed electronically. Number of Addenda: 0

## 2017-05-02 ENCOUNTER — Telehealth: Payer: Self-pay | Admitting: Gastroenterology

## 2017-05-02 ENCOUNTER — Encounter (HOSPITAL_COMMUNITY): Payer: Self-pay | Admitting: Gastroenterology

## 2017-05-02 MED ORDER — BUDESONIDE 3 MG PO CPEP
ORAL_CAPSULE | ORAL | 0 refills | Status: DC
Start: 2017-05-02 — End: 2022-10-30

## 2017-05-02 NOTE — Telephone Encounter (Signed)
PLEASE CALL PT. 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 MEDS THAT CAN TRIGGERS THIS ARE ASPIRIN, AND NSAIDS. SHE NEEDS BUDESONIDE 9 MG DAILY FOR 4 WEEKS THEN 6 MG FOR 2 WEEKS THEN 3 MG FOR 2 WEEK. THE GOAL IS LESS THAN 3 STOOLS DAILY AND < 1 WATERY STOOL DAILY.  HER DIARRHEA SHOULD IMPROVE OVER THE NEXT WEEK. SHE SHOULD CALL IF HER DIARRHEA RETURNS AFTER SHE STARTS TO TAPER THE DOSE.   DRINK WATER TO KEEP YOUR URINE LIGHT YELLOW. FOLLOW A HIGH FIBER DIET.  OPV MAY 2019 E30 DIARRHEA/COLON POLYP NEXT TCS IN 5 YEARS.

## 2017-05-02 NOTE — Telephone Encounter (Signed)
LMOM that medication has been sent in. Please call me on Monday.

## 2017-05-02 NOTE — Telephone Encounter (Signed)
PATIENT SCHEDULED AND ON RECALL  °

## 2017-05-02 NOTE — Telephone Encounter (Signed)
LMOM for a return call.  

## 2017-05-05 ENCOUNTER — Telehealth: Payer: Self-pay | Admitting: Gastroenterology

## 2017-05-05 ENCOUNTER — Telehealth: Payer: Self-pay

## 2017-05-05 NOTE — Telephone Encounter (Signed)
Pt's daughter called and asked about results also, Ayrabella Labombard and she was informed.

## 2017-05-05 NOTE — Telephone Encounter (Signed)
See result note.  

## 2017-05-05 NOTE — Telephone Encounter (Signed)
LMOM to call.

## 2017-05-05 NOTE — Telephone Encounter (Signed)
[phone] PATIENT RETURNED CALL AND ASKED THAT YOU CALL BACK

## 2017-05-05 NOTE — Telephone Encounter (Signed)
PT's husband Jeneen Rinks called for results ( said she cannot hear but she reads his lips). He is aware and will pick up meds.

## 2017-05-07 ENCOUNTER — Encounter: Payer: Self-pay | Admitting: Gastroenterology

## 2017-05-08 ENCOUNTER — Telehealth: Payer: Self-pay

## 2017-05-08 NOTE — Telephone Encounter (Signed)
PT's husband is aware and said the pt is doing a lot better. Said she was sick for 3 weeks before she came in, but is much better. He said they cannot afford the medication. Pt is not having diarrhea at this time but is aware to take Imodium 2-3 a day if needed and to take Omeprazole every other day and stay away from Ibuprofen.  The pharmacist is also aware not to expect a different prescription.

## 2017-05-08 NOTE — Telephone Encounter (Addendum)
PLEASE CALL PT. THERE IS NO BETTER ALTERNATIVE TO BUDESONIDE. PREDNISONE ONLY WORKS HALF THE TIME AND IS ASSOCIATED WITH MORE SIDE EFFECTS. MESALAMINE AND BALSLAZIDE DO NOT WORK.  IBUPROFEN CAN MAKE HER COLITIS WORSE. SHE SHOULD AVOID IT. OMEPRAZOLE CAN BE ASSOCIATED WITH THE COLITIS AS WELL BUT IF SH NEEDS IT SHE CAN TAKE IT EVERY OTHER DAY. OTHERWISE SHE CAN USE IMODIUM 2-3 TIMES A DAY TO CONTROL THE DIARRHEA  UNTIL SHE CAN AFFORD THE ENTOCORT.

## 2017-05-08 NOTE — Telephone Encounter (Signed)
T/C from [facility] at [facility]. Said the Budesonide was too expensive for pt. She has a high deductible and it would cost her $160.00 and she cannot afford.  She asked if maybe Balsalazide or Mesalamine might work. Dr. Oneida Alar, please advise! Call back number at [facility] is [phone].

## 2017-07-28 ENCOUNTER — Ambulatory Visit: Payer: BLUE CROSS/BLUE SHIELD | Admitting: Gastroenterology

## 2017-07-28 ENCOUNTER — Encounter: Payer: Self-pay | Admitting: Gastroenterology

## 2019-03-30 DIAGNOSIS — Z299 Encounter for prophylactic measures, unspecified: Secondary | ICD-10-CM | POA: Diagnosis not present

## 2019-03-30 DIAGNOSIS — Z7189 Other specified counseling: Secondary | ICD-10-CM | POA: Diagnosis not present

## 2019-03-30 DIAGNOSIS — I10 Essential (primary) hypertension: Secondary | ICD-10-CM | POA: Diagnosis not present

## 2019-03-30 DIAGNOSIS — Z0000 Encounter for general adult medical examination without abnormal findings: Secondary | ICD-10-CM | POA: Diagnosis not present

## 2019-03-30 DIAGNOSIS — E7800 Pure hypercholesterolemia, unspecified: Secondary | ICD-10-CM | POA: Diagnosis not present

## 2019-03-30 DIAGNOSIS — Z79899 Other long term (current) drug therapy: Secondary | ICD-10-CM | POA: Diagnosis not present

## 2019-03-30 DIAGNOSIS — Z1211 Encounter for screening for malignant neoplasm of colon: Secondary | ICD-10-CM | POA: Diagnosis not present

## 2019-04-12 DIAGNOSIS — L821 Other seborrheic keratosis: Secondary | ICD-10-CM | POA: Diagnosis not present

## 2019-04-12 DIAGNOSIS — D225 Melanocytic nevi of trunk: Secondary | ICD-10-CM | POA: Diagnosis not present

## 2019-05-20 DIAGNOSIS — M2669 Other specified disorders of temporomandibular joint: Secondary | ICD-10-CM | POA: Diagnosis not present

## 2019-05-20 DIAGNOSIS — R519 Headache, unspecified: Secondary | ICD-10-CM | POA: Diagnosis not present

## 2019-06-22 ENCOUNTER — Ambulatory Visit: Payer: Medicare Other

## 2019-06-22 ENCOUNTER — Ambulatory Visit: Payer: Medicare Other | Admitting: Orthopaedic Surgery

## 2019-06-22 ENCOUNTER — Encounter: Payer: Self-pay | Admitting: Orthopaedic Surgery

## 2019-06-22 ENCOUNTER — Other Ambulatory Visit: Payer: Self-pay

## 2019-06-22 VITALS — HR 81 | Temp 97.3°F | Ht 63.0 in | Wt 194.1 lb

## 2019-06-22 DIAGNOSIS — M25562 Pain in left knee: Secondary | ICD-10-CM

## 2019-06-22 DIAGNOSIS — G8929 Other chronic pain: Secondary | ICD-10-CM

## 2019-06-22 MED ORDER — NAPROXEN 500 MG PO TABS: 500.0000 mg | ORAL_TABLET | Freq: Two times a day (BID) | ORAL | 5 refills | Status: DC

## 2019-06-22 MED ORDER — HYDROCODONE-ACETAMINOPHEN 5-325 MG PO TABS: ORAL_TABLET | ORAL | 0 refills | Status: DC

## 2019-06-22 NOTE — Progress Notes (Signed)
Patient OH:[id number] Brenda Love, female DOB:1956-01-09, 64 y.o. [id number]  Chief Complaint  Patient presents with  . Knee Pain    L/fell about 5 months ago/aches now    HPI  Brenda Love is a 64 y.o. female who has developed pain in the left knee with swelling, popping but no giving way.  It has been present about a month.  Nothing seems to help it.  She has no trauma.  She is very hard of hearing.   Body mass index is 34.39 kg/m.  ROS  Review of Systems  Constitutional: Positive for activity change.  HENT: Positive for hearing loss.   Musculoskeletal: Positive for arthralgias, gait problem and joint swelling.    All other systems reviewed and are negative.  The following is a summary of the past history medically, past history surgically, known current medicines, social history and family history.  This information is gathered electronically by the computer from prior information and documentation.  I review this each visit and have found including this information at this point in the chart is beneficial and informative.    Past Medical History:  Diagnosis Date  . Colon polyps    Dr. Rowe Pavy  . Complication of anesthesia    woke up during procedure  . GERD (gastroesophageal reflux disease)   . H/O hiatal hernia   . Hard of hearing   . HOH (hard of hearing)    hearing aids  . Hypercholesteremia   . Hypertension   . Myofascial pain on right side 05/10/2014  . Obesity   . Pneumonia   . PONV (postoperative nausea and vomiting)   . S/P colonoscopy 10/01/2003   Dr Levert Feinstein  . TMJ syndrome   . Tremor 05/10/2014    Past Surgical History:  Procedure Laterality Date  . ARTHROTOMY Right 12/28/2012   Procedure: Temporomandibular Joint Arthrotomy; Meniscectomy;  Surgeon: Gae Bon, DDS;  Location: [facility];  Service: Oral Surgery;  Laterality: Right;  . ARTHROTOMY Right 07/09/2013   Procedure: ARTHROTOMY CONDYLECTOMY,TMJ disc fragment removal  AND TEMPORALIS GRAFT;   Surgeon: Gae Bon, DDS;  Location: [facility];  Service: Oral Surgery;  Laterality: Right;  . BIOPSY  04/29/2017   Procedure: BIOPSY;  Surgeon: Danie Binder, MD;  [facility];  Service: Endoscopy;;  colon  . BREAST SURGERY     Hx: of left lumpectomy  . CHOLECYSTECTOMY    . COLONOSCOPY N/A 04/29/2017   Procedure: COLONOSCOPY;  Surgeon: Danie Binder, MD;  [facility];  Service: Endoscopy;  Laterality: N/A;  3:00  . ESOPHAGOGASTRODUODENOSCOPY  03/13/2011   Moderate gastritis in the antrum/SMALL Hiatal hernia  . ILEOColonoscopy  03/13/2011   MILD Diverticulosis in the sigmoid to descending colon/ Internal hemorrhoids/POLYPOID LESIONS IN COLON IC VALVE LESION MOST LIKELY A HEALING ULCER FROM NSAID INJURY HEME POS STOOLS 2o TO HEMORRHOIDS, NSAID COLOPATHY/GASTRITIS  . POLYPECTOMY  04/29/2017   Procedure: POLYPECTOMY;  Surgeon: Danie Binder, MD;  [facility];  Service: Endoscopy;;  colon  . TEMPOROMANDIBULAR JOINT SURGERY    . TONSILLECTOMY    . VAGINAL HYSTERECTOMY    . VAGINAL PROLAPSE REPAIR      Family History  Problem Relation Age of Onset  . Colon cancer Mother 20  . Hypertension Mother   . Diabetes type I Mother   . Cancer Mother   . Diabetes Sister   . Coronary artery disease Father   . Emphysema Father   . Colon polyps  Neg Hx     Social History Social History   Tobacco Use  . Smoking status: Former Smoker    Packs/day: 1.00    Years: 5.00    Pack years: 5.00    Types: Cigarettes    Quit date: 02/27/1994    Years since quitting: 25.3  . Smokeless tobacco: Never Used  Substance Use Topics  . Alcohol use: No  . Drug use: No    Allergies  Allergen Reactions  . Codeine Hives and Nausea And Vomiting  . Sulfa Antibiotics Nausea And Vomiting    Current Outpatient Medications  Medication Sig Dispense Refill  . cyclobenzaprine (FLEXERIL) 10 MG tablet Take 1 tablet (10 mg total) by mouth 3 (three) times daily as needed for  muscle spasms. (Patient taking differently: Take 10 mg by mouth at bedtime. ) 90 tablet 3  . ibuprofen (ADVIL,MOTRIN) 200 MG tablet Take 200 mg by mouth 2 (two) times daily as needed (for pain.).    Marland Kitchen metoprolol (LOPRESSOR) 50 MG tablet Take 25-50 mg by mouth 2 (two) times daily. Take 1 tablet (50 mg) by mouth in the morning & 0.5 tablet (25 mg) in the evening.    . Multiple Vitamin (MULITIVITAMIN WITH MINERALS) TABS Take 1 tablet by mouth daily.     . Omega-3 Fatty Acids (FISH OIL) 500 MG CAPS Take 500 mg by mouth daily.    Marland Kitchen omeprazole (PRILOSEC) 20 MG capsule Take 1 capsule (20 mg total) by mouth daily. (Patient taking differently: Take 20 mg by mouth daily before breakfast. ) 30 capsule 5  . pravastatin (PRAVACHOL) 10 MG tablet Take 10 mg by mouth daily with supper.   3  . budesonide (ENTOCORT EC) 3 MG 24 hr capsule 3 PO DAILY FOR 4 WEEKS THEN 2 PO DAILY FOR 2 WEEKS THEN 1 PO DAILY FOR 2 WEEKS (Patient not taking: Reported on 06/22/2019) 132 capsule 0  . HYDROcodone-acetaminophen (NORCO/VICODIN) 5-325 MG tablet One tablet every four hours as needed for acute pain.  Limit of five days per [place] statue. 30 tablet 0  . naproxen (NAPROSYN) 500 MG tablet Take 1 tablet (500 mg total) by mouth 2 (two) times daily with a meal. 60 tablet 5   No current facility-administered medications for this visit.     Physical Exam  Pulse 81, temperature (!) 97.3 F (36.3 C), height 5\' 3"  (1.6 m), weight 194 lb 2 oz (88.1 kg).  Constitutional: overall normal hygiene, normal nutrition, well developed, normal grooming, normal body habitus. Assistive device:none  Musculoskeletal: gait and station Limp left, muscle tone and strength are normal, no tremors or atrophy is present.  .  Neurological: coordination overall normal.  Deep tendon reflex/nerve stretch intact.  Sensation normal.  Cranial nerves II-XII intact.   Skin:   Normal overall no scars, lesions, ulcers or rashes. No psoriasis.  Psychiatric:  Alert and oriented x 3.  Recent memory intact, remote memory unclear.  Normal mood and affect. Well groomed.  Good eye contact.  Cardiovascular: overall no swelling, no varicosities, no edema bilaterally, normal temperatures of the legs and arms, no clubbing, cyanosis and good capillary refill.  Lymphatic: palpation is normal.  Left knee has swelling, ROM 0 to 105, limp left, NV intact.  Stable.  All other systems reviewed and are negative   The patient has been educated about the nature of the problem(s) and counseled on treatment options.  The patient appeared to understand what I have discussed and is in agreement with it.  Encounter Diagnosis  Name Primary?  . Chronic pain of left knee Yes   X-rays were done of the left knee, reported separately.  PROCEDURE NOTE:  The patient requests injections of the left knee , verbal consent was obtained.  The left knee was prepped appropriately after time out was performed.   Sterile technique was observed and injection of 1 cc of Depo-Medrol 40 mg with several cc's of plain xylocaine. Anesthesia was provided by ethyl chloride and a 20-gauge needle was used to inject the knee area. The injection was tolerated well.  A band aid dressing was applied.  The patient was advised to apply ice later today and tomorrow to the injection sight as needed.   PLAN Call if any problems.  Precautions discussed. Return to clinic 2 weeks   I will call in pain medicine and Naprosyn.  I have reviewed the [place] [facility] web site prior to prescribing narcotic medicine for this patient.   Electronically Signed Sanjuana Kava, MD 4/13/20212:50 PM

## 2019-07-08 DIAGNOSIS — I10 Essential (primary) hypertension: Secondary | ICD-10-CM | POA: Diagnosis not present

## 2019-07-08 DIAGNOSIS — Z299 Encounter for prophylactic measures, unspecified: Secondary | ICD-10-CM | POA: Diagnosis not present

## 2019-07-08 DIAGNOSIS — Z713 Dietary counseling and surveillance: Secondary | ICD-10-CM | POA: Diagnosis not present

## 2019-08-12 DIAGNOSIS — R1909 Other intra-abdominal and pelvic swelling, mass and lump: Secondary | ICD-10-CM | POA: Diagnosis not present

## 2019-08-26 DIAGNOSIS — H2511 Age-related nuclear cataract, right eye: Secondary | ICD-10-CM | POA: Diagnosis not present

## 2019-08-26 DIAGNOSIS — H25011 Cortical age-related cataract, right eye: Secondary | ICD-10-CM | POA: Diagnosis not present

## 2019-08-26 DIAGNOSIS — H353122 Nonexudative age-related macular degeneration, left eye, intermediate dry stage: Secondary | ICD-10-CM | POA: Diagnosis not present

## 2019-08-26 DIAGNOSIS — H40013 Open angle with borderline findings, low risk, bilateral: Secondary | ICD-10-CM | POA: Diagnosis not present

## 2019-09-22 DIAGNOSIS — Z01818 Encounter for other preprocedural examination: Secondary | ICD-10-CM | POA: Diagnosis not present

## 2019-09-22 DIAGNOSIS — Z419 Encounter for procedure for purposes other than remedying health state, unspecified: Secondary | ICD-10-CM | POA: Diagnosis not present

## 2019-09-22 DIAGNOSIS — I10 Essential (primary) hypertension: Secondary | ICD-10-CM | POA: Diagnosis not present

## 2019-09-24 DIAGNOSIS — I10 Essential (primary) hypertension: Secondary | ICD-10-CM | POA: Diagnosis not present

## 2019-09-24 DIAGNOSIS — Z780 Asymptomatic menopausal state: Secondary | ICD-10-CM | POA: Diagnosis not present

## 2019-09-25 DIAGNOSIS — Z780 Asymptomatic menopausal state: Secondary | ICD-10-CM | POA: Diagnosis not present

## 2019-09-25 DIAGNOSIS — I10 Essential (primary) hypertension: Secondary | ICD-10-CM | POA: Diagnosis not present

## 2019-10-18 DIAGNOSIS — H353122 Nonexudative age-related macular degeneration, left eye, intermediate dry stage: Secondary | ICD-10-CM | POA: Diagnosis not present

## 2019-10-18 DIAGNOSIS — H25041 Posterior subcapsular polar age-related cataract, right eye: Secondary | ICD-10-CM | POA: Diagnosis not present

## 2019-10-18 DIAGNOSIS — H25011 Cortical age-related cataract, right eye: Secondary | ICD-10-CM | POA: Diagnosis not present

## 2019-10-18 DIAGNOSIS — H2511 Age-related nuclear cataract, right eye: Secondary | ICD-10-CM | POA: Diagnosis not present

## 2019-11-30 DIAGNOSIS — H25041 Posterior subcapsular polar age-related cataract, right eye: Secondary | ICD-10-CM | POA: Diagnosis not present

## 2019-11-30 DIAGNOSIS — H2511 Age-related nuclear cataract, right eye: Secondary | ICD-10-CM | POA: Diagnosis not present

## 2019-11-30 DIAGNOSIS — H25011 Cortical age-related cataract, right eye: Secondary | ICD-10-CM | POA: Diagnosis not present

## 2019-11-30 DIAGNOSIS — H25811 Combined forms of age-related cataract, right eye: Secondary | ICD-10-CM | POA: Diagnosis not present

## 2019-12-15 DIAGNOSIS — Z9889 Other specified postprocedural states: Secondary | ICD-10-CM | POA: Diagnosis not present

## 2019-12-15 DIAGNOSIS — I6529 Occlusion and stenosis of unspecified carotid artery: Secondary | ICD-10-CM | POA: Diagnosis not present

## 2019-12-15 DIAGNOSIS — M2669 Other specified disorders of temporomandibular joint: Secondary | ICD-10-CM | POA: Diagnosis not present

## 2019-12-15 DIAGNOSIS — K0401 Reversible pulpitis: Secondary | ICD-10-CM | POA: Diagnosis not present

## 2019-12-15 DIAGNOSIS — M1909 Primary osteoarthritis, other specified site: Secondary | ICD-10-CM | POA: Diagnosis not present

## 2019-12-15 DIAGNOSIS — M26603 Bilateral temporomandibular joint disorder, unspecified: Secondary | ICD-10-CM | POA: Diagnosis not present

## 2019-12-24 DIAGNOSIS — Z299 Encounter for prophylactic measures, unspecified: Secondary | ICD-10-CM | POA: Diagnosis not present

## 2019-12-24 DIAGNOSIS — M26649 Arthritis of unspecified temporomandibular joint: Secondary | ICD-10-CM | POA: Diagnosis not present

## 2019-12-24 DIAGNOSIS — I10 Essential (primary) hypertension: Secondary | ICD-10-CM | POA: Diagnosis not present

## 2019-12-24 DIAGNOSIS — R251 Tremor, unspecified: Secondary | ICD-10-CM | POA: Diagnosis not present

## 2019-12-24 DIAGNOSIS — R6884 Jaw pain: Secondary | ICD-10-CM | POA: Diagnosis not present

## 2020-01-12 DIAGNOSIS — Z87891 Personal history of nicotine dependence: Secondary | ICD-10-CM | POA: Diagnosis not present

## 2020-01-12 DIAGNOSIS — Z299 Encounter for prophylactic measures, unspecified: Secondary | ICD-10-CM | POA: Diagnosis not present

## 2020-01-12 DIAGNOSIS — R6884 Jaw pain: Secondary | ICD-10-CM | POA: Diagnosis not present

## 2020-01-12 DIAGNOSIS — S025XXA Fracture of tooth (traumatic), initial encounter for closed fracture: Secondary | ICD-10-CM | POA: Diagnosis not present

## 2020-01-12 DIAGNOSIS — I10 Essential (primary) hypertension: Secondary | ICD-10-CM | POA: Diagnosis not present

## 2020-01-12 DIAGNOSIS — R220 Localized swelling, mass and lump, head: Secondary | ICD-10-CM | POA: Diagnosis not present

## 2020-01-12 DIAGNOSIS — G8929 Other chronic pain: Secondary | ICD-10-CM | POA: Diagnosis not present

## 2020-01-26 DIAGNOSIS — I10 Essential (primary) hypertension: Secondary | ICD-10-CM | POA: Diagnosis not present

## 2020-01-26 DIAGNOSIS — Z87891 Personal history of nicotine dependence: Secondary | ICD-10-CM | POA: Diagnosis not present

## 2020-01-26 DIAGNOSIS — K0401 Reversible pulpitis: Secondary | ICD-10-CM | POA: Diagnosis not present

## 2020-01-26 DIAGNOSIS — Z299 Encounter for prophylactic measures, unspecified: Secondary | ICD-10-CM | POA: Diagnosis not present

## 2020-03-09 DIAGNOSIS — M26603 Bilateral temporomandibular joint disorder, unspecified: Secondary | ICD-10-CM | POA: Diagnosis not present

## 2020-03-17 DIAGNOSIS — I10 Essential (primary) hypertension: Secondary | ICD-10-CM | POA: Diagnosis not present

## 2020-03-17 DIAGNOSIS — Z01818 Encounter for other preprocedural examination: Secondary | ICD-10-CM | POA: Diagnosis not present

## 2020-03-17 DIAGNOSIS — M26609 Unspecified temporomandibular joint disorder, unspecified side: Secondary | ICD-10-CM | POA: Diagnosis not present

## 2020-05-11 DIAGNOSIS — Z0000 Encounter for general adult medical examination without abnormal findings: Secondary | ICD-10-CM | POA: Diagnosis not present

## 2020-05-11 DIAGNOSIS — R5383 Other fatigue: Secondary | ICD-10-CM | POA: Diagnosis not present

## 2020-05-11 DIAGNOSIS — Z299 Encounter for prophylactic measures, unspecified: Secondary | ICD-10-CM | POA: Diagnosis not present

## 2020-05-11 DIAGNOSIS — E7800 Pure hypercholesterolemia, unspecified: Secondary | ICD-10-CM | POA: Diagnosis not present

## 2020-05-11 DIAGNOSIS — Z789 Other specified health status: Secondary | ICD-10-CM | POA: Diagnosis not present

## 2020-05-11 DIAGNOSIS — I10 Essential (primary) hypertension: Secondary | ICD-10-CM | POA: Diagnosis not present

## 2020-05-11 DIAGNOSIS — H60509 Unspecified acute noninfective otitis externa, unspecified ear: Secondary | ICD-10-CM | POA: Diagnosis not present

## 2020-05-11 DIAGNOSIS — Z79899 Other long term (current) drug therapy: Secondary | ICD-10-CM | POA: Diagnosis not present

## 2020-05-18 DIAGNOSIS — I10 Essential (primary) hypertension: Secondary | ICD-10-CM | POA: Diagnosis not present

## 2020-05-18 DIAGNOSIS — Z299 Encounter for prophylactic measures, unspecified: Secondary | ICD-10-CM | POA: Diagnosis not present

## 2020-05-18 DIAGNOSIS — H6090 Unspecified otitis externa, unspecified ear: Secondary | ICD-10-CM | POA: Diagnosis not present

## 2020-05-31 DIAGNOSIS — K219 Gastro-esophageal reflux disease without esophagitis: Secondary | ICD-10-CM | POA: Diagnosis not present

## 2020-05-31 DIAGNOSIS — G244 Idiopathic orofacial dystonia: Secondary | ICD-10-CM | POA: Diagnosis not present

## 2020-05-31 DIAGNOSIS — Z79899 Other long term (current) drug therapy: Secondary | ICD-10-CM | POA: Diagnosis not present

## 2020-05-31 DIAGNOSIS — Z87891 Personal history of nicotine dependence: Secondary | ICD-10-CM | POA: Diagnosis not present

## 2020-05-31 DIAGNOSIS — K029 Dental caries, unspecified: Secondary | ICD-10-CM | POA: Diagnosis not present

## 2020-05-31 DIAGNOSIS — M26601 Right temporomandibular joint disorder, unspecified: Secondary | ICD-10-CM | POA: Diagnosis not present

## 2020-05-31 DIAGNOSIS — M26603 Bilateral temporomandibular joint disorder, unspecified: Secondary | ICD-10-CM | POA: Diagnosis not present

## 2020-05-31 DIAGNOSIS — E785 Hyperlipidemia, unspecified: Secondary | ICD-10-CM | POA: Diagnosis not present

## 2020-05-31 DIAGNOSIS — I10 Essential (primary) hypertension: Secondary | ICD-10-CM | POA: Diagnosis not present

## 2020-08-11 ENCOUNTER — Other Ambulatory Visit: Payer: Self-pay | Admitting: Internal Medicine

## 2020-08-11 DIAGNOSIS — Z139 Encounter for screening, unspecified: Secondary | ICD-10-CM

## 2020-08-30 DIAGNOSIS — Z713 Dietary counseling and surveillance: Secondary | ICD-10-CM | POA: Diagnosis not present

## 2020-08-30 DIAGNOSIS — I10 Essential (primary) hypertension: Secondary | ICD-10-CM | POA: Diagnosis not present

## 2020-08-30 DIAGNOSIS — Z299 Encounter for prophylactic measures, unspecified: Secondary | ICD-10-CM | POA: Diagnosis not present

## 2020-08-30 DIAGNOSIS — R21 Rash and other nonspecific skin eruption: Secondary | ICD-10-CM | POA: Diagnosis not present

## 2021-01-16 DIAGNOSIS — J018 Other acute sinusitis: Secondary | ICD-10-CM | POA: Diagnosis not present

## 2021-01-16 DIAGNOSIS — Z299 Encounter for prophylactic measures, unspecified: Secondary | ICD-10-CM | POA: Diagnosis not present

## 2021-01-16 DIAGNOSIS — J069 Acute upper respiratory infection, unspecified: Secondary | ICD-10-CM | POA: Diagnosis not present

## 2021-01-16 DIAGNOSIS — K1120 Sialoadenitis, unspecified: Secondary | ICD-10-CM | POA: Diagnosis not present

## 2021-03-07 DIAGNOSIS — I10 Essential (primary) hypertension: Secondary | ICD-10-CM | POA: Diagnosis not present

## 2021-03-07 DIAGNOSIS — Z299 Encounter for prophylactic measures, unspecified: Secondary | ICD-10-CM | POA: Diagnosis not present

## 2021-03-07 DIAGNOSIS — N390 Urinary tract infection, site not specified: Secondary | ICD-10-CM | POA: Diagnosis not present

## 2021-09-25 DIAGNOSIS — Z0000 Encounter for general adult medical examination without abnormal findings: Secondary | ICD-10-CM | POA: Diagnosis not present

## 2021-09-25 DIAGNOSIS — Z7189 Other specified counseling: Secondary | ICD-10-CM | POA: Diagnosis not present

## 2021-09-25 DIAGNOSIS — E7800 Pure hypercholesterolemia, unspecified: Secondary | ICD-10-CM | POA: Diagnosis not present

## 2021-09-25 DIAGNOSIS — Z299 Encounter for prophylactic measures, unspecified: Secondary | ICD-10-CM | POA: Diagnosis not present

## 2021-09-25 DIAGNOSIS — Z1331 Encounter for screening for depression: Secondary | ICD-10-CM | POA: Diagnosis not present

## 2021-09-25 DIAGNOSIS — Z789 Other specified health status: Secondary | ICD-10-CM | POA: Diagnosis not present

## 2021-09-25 DIAGNOSIS — Z1339 Encounter for screening examination for other mental health and behavioral disorders: Secondary | ICD-10-CM | POA: Diagnosis not present

## 2021-09-25 DIAGNOSIS — R5383 Other fatigue: Secondary | ICD-10-CM | POA: Diagnosis not present

## 2021-09-25 DIAGNOSIS — Z79899 Other long term (current) drug therapy: Secondary | ICD-10-CM | POA: Diagnosis not present

## 2021-09-25 DIAGNOSIS — I10 Essential (primary) hypertension: Secondary | ICD-10-CM | POA: Diagnosis not present

## 2021-09-25 DIAGNOSIS — Z6835 Body mass index (BMI) 35.0-35.9, adult: Secondary | ICD-10-CM | POA: Diagnosis not present

## 2021-10-11 DIAGNOSIS — M26649 Arthritis of unspecified temporomandibular joint: Secondary | ICD-10-CM | POA: Diagnosis not present

## 2021-10-11 DIAGNOSIS — J069 Acute upper respiratory infection, unspecified: Secondary | ICD-10-CM | POA: Diagnosis not present

## 2021-10-11 DIAGNOSIS — I10 Essential (primary) hypertension: Secondary | ICD-10-CM | POA: Diagnosis not present

## 2021-10-11 DIAGNOSIS — Z299 Encounter for prophylactic measures, unspecified: Secondary | ICD-10-CM | POA: Diagnosis not present

## 2021-10-11 DIAGNOSIS — Z789 Other specified health status: Secondary | ICD-10-CM | POA: Diagnosis not present

## 2021-12-05 ENCOUNTER — Ambulatory Visit (INDEPENDENT_AMBULATORY_CARE_PROVIDER_SITE_OTHER): Payer: Medicare HMO | Admitting: Orthopedic Surgery

## 2021-12-05 ENCOUNTER — Encounter: Payer: Self-pay | Admitting: Orthopedic Surgery

## 2021-12-05 ENCOUNTER — Ambulatory Visit (INDEPENDENT_AMBULATORY_CARE_PROVIDER_SITE_OTHER): Payer: Medicare HMO

## 2021-12-05 VITALS — Ht 63.0 in | Wt 194.0 lb

## 2021-12-05 DIAGNOSIS — G8929 Other chronic pain: Secondary | ICD-10-CM | POA: Diagnosis not present

## 2021-12-05 DIAGNOSIS — M545 Low back pain, unspecified: Secondary | ICD-10-CM | POA: Diagnosis not present

## 2021-12-05 NOTE — Progress Notes (Signed)
New Patient Visit  Assessment: Brenda Love is a 66 y.o. female with the following: 1. Chronic left-sided low back pain without sciatica ***   Plan: Brenda Love   Follow-up: No follow-ups on file.  Subjective:  Chief Complaint  Patient presents with   Back Pain    LBP for years, states she did receive an ESI approx 20 yrs ago for pain but due to being a mom/work hasn't been able to come in before now     History of Present Illness: Brenda Love is a 66 y.o. female who {Presentation:27320} for evaluation of    Review of Systems: No fevers or chills*** No numbness or tingling No chest pain No shortness of breath No bowel or bladder dysfunction No GI distress No headaches   Medical History:  Past Medical History:  Diagnosis Date   Colon polyps    Dr. Rowe Pavy   Complication of anesthesia    woke up during procedure   GERD (gastroesophageal reflux disease)    H/O hiatal hernia    Hard of hearing    HOH (hard of hearing)    hearing aids   Hypercholesteremia    Hypertension    Myofascial pain on right side 05/10/2014   Obesity    Pneumonia    PONV (postoperative nausea and vomiting)    S/P colonoscopy 10/01/2003   Dr Levert Feinstein   TMJ syndrome    Tremor 05/10/2014    Past Surgical History:  Procedure Laterality Date   ARTHROTOMY Right 12/28/2012   Procedure: Temporomandibular Joint Arthrotomy; Meniscectomy;  Surgeon: Gae Bon, DDS;  Location: [facility];  Service: Oral Surgery;  Laterality: Right;   ARTHROTOMY Right 07/09/2013   Procedure: ARTHROTOMY CONDYLECTOMY,TMJ disc fragment removal  AND TEMPORALIS GRAFT;  Surgeon: Gae Bon, DDS;  Location: [facility];  Service: Oral Surgery;  Laterality: Right;   BIOPSY  04/29/2017   Procedure: BIOPSY;  Surgeon: Danie Binder, MD;  [facility];  Service: Endoscopy;;  colon   BREAST SURGERY     Hx: of left lumpectomy   CHOLECYSTECTOMY     COLONOSCOPY N/A 04/29/2017   Procedure: COLONOSCOPY;   Surgeon: Danie Binder, MD;  [facility];  Service: Endoscopy;  Laterality: N/A;  3:00   ESOPHAGOGASTRODUODENOSCOPY  03/13/2011   Moderate gastritis in the antrum/SMALL Hiatal hernia   ILEOColonoscopy  03/13/2011   MILD Diverticulosis in the sigmoid to descending colon/ Internal hemorrhoids/POLYPOID LESIONS IN COLON IC VALVE LESION MOST LIKELY A HEALING ULCER FROM NSAID INJURY HEME POS STOOLS 2o TO HEMORRHOIDS, NSAID COLOPATHY/GASTRITIS   POLYPECTOMY  04/29/2017   Procedure: POLYPECTOMY;  Surgeon: Danie Binder, MD;  [facility];  Service: Endoscopy;;  colon   TEMPOROMANDIBULAR JOINT SURGERY     TONSILLECTOMY     VAGINAL HYSTERECTOMY     VAGINAL PROLAPSE REPAIR      Family History  Problem Relation Age of Onset   Colon cancer Mother 30   Hypertension Mother    Diabetes type I Mother    Cancer Mother    Diabetes Sister    Coronary artery disease Father    Emphysema Father    Colon polyps Neg Hx    Social History   Tobacco Use   Smoking status: Former    Packs/day: 1.00    Years: 5.00    Total pack years: 5.00    Types: Cigarettes    Quit date: 02/27/1994    Years since quitting: 27.7  Smokeless tobacco: Never  Substance Use Topics   Alcohol use: No   Drug use: No    Allergies  Allergen Reactions   Codeine Hives and Nausea And Vomiting   Sulfa Antibiotics Nausea And Vomiting    No outpatient medications have been marked as taking for the 12/05/21 encounter (Office Visit) with Mordecai Rasmussen, MD.    Objective: Ht '5\' 3"'$  (1.6 m)   Wt 194 lb (88 kg)   BMI 34.37 kg/m   Physical Exam:  General: {General PE Findings:25791} Gait: {Gait:25792}    IMAGING: I personally ordered and reviewed the following images   X-rays of the lumbar spine were obtained in clinic today.  No acute injuries are noted.  No evidence of anterolisthesis.  There are degenerative changes throughout, with mild loss of disc height, and associated osteophytes.  No  scoliosis.  Impression: Lumbar spine x-rays with diffuse degenerative changes.  New Medications:  No orders of the defined types were placed in this encounter.     Mordecai Rasmussen, MD  12/05/2021 11:02 AM

## 2021-12-06 ENCOUNTER — Encounter: Payer: Self-pay | Admitting: Orthopedic Surgery

## 2022-03-19 ENCOUNTER — Encounter: Payer: Self-pay | Admitting: Internal Medicine

## 2022-03-27 DIAGNOSIS — Z299 Encounter for prophylactic measures, unspecified: Secondary | ICD-10-CM | POA: Diagnosis not present

## 2022-03-27 DIAGNOSIS — M7072 Other bursitis of hip, left hip: Secondary | ICD-10-CM | POA: Diagnosis not present

## 2022-03-27 DIAGNOSIS — I10 Essential (primary) hypertension: Secondary | ICD-10-CM | POA: Diagnosis not present

## 2022-04-18 ENCOUNTER — Other Ambulatory Visit: Payer: Self-pay | Admitting: Internal Medicine

## 2022-04-18 DIAGNOSIS — Z1231 Encounter for screening mammogram for malignant neoplasm of breast: Secondary | ICD-10-CM

## 2022-04-22 ENCOUNTER — Ambulatory Visit
Admission: RE | Admit: 2022-04-22 | Discharge: 2022-04-22 | Disposition: A | Discharge status: Home / Self Care | Payer: Medicare HMO | Source: Ambulatory Visit | Attending: Internal Medicine | Admitting: Internal Medicine

## 2022-04-22 DIAGNOSIS — Z1231 Encounter for screening mammogram for malignant neoplasm of breast: Secondary | ICD-10-CM | POA: Diagnosis not present

## 2022-04-25 ENCOUNTER — Other Ambulatory Visit: Payer: Self-pay | Admitting: Internal Medicine

## 2022-04-25 DIAGNOSIS — R928 Other abnormal and inconclusive findings on diagnostic imaging of breast: Secondary | ICD-10-CM

## 2022-05-06 ENCOUNTER — Ambulatory Visit: Payer: Medicare HMO | Admitting: Orthopedic Surgery

## 2022-05-06 ENCOUNTER — Encounter: Payer: Self-pay | Admitting: Orthopedic Surgery

## 2022-05-06 DIAGNOSIS — M545 Low back pain, unspecified: Secondary | ICD-10-CM

## 2022-05-06 DIAGNOSIS — G8929 Other chronic pain: Secondary | ICD-10-CM | POA: Diagnosis not present

## 2022-05-06 MED ORDER — DICLOFENAC SODIUM 50 MG PO TBEC: 50.0000 mg | DELAYED_RELEASE_TABLET | Freq: Two times a day (BID) | ORAL | 0 refills | Status: DC

## 2022-05-06 NOTE — Progress Notes (Signed)
New Patient Visit  Assessment: Brenda Love is a 67 y.o. female with the following: 1. Chronic left-sided low back pain without sciatica  Plan: Brenda Love continues to have pain in her lower back.  She has occasional radiating pains into both posterior legs.  Pain remains unchanged.  She is taking Tylenol, and ibuprofen occasionally.  She is interested in MRI.  Will place the order.  She will also start physical therapy for her lower back.  Prescription for diclofenac provided.  Follow-up: Return for After MRI.  Subjective:  Chief Complaint  Patient presents with   Back Pain    LBP/ follow up Pain is the same    History of Present Illness: Brenda Love is a 67 y.o. female who returns for evaluation of low back pain.  She continues to have lower back pain.  It is very tender in the midportion of her back.  Occasional radiating pains in the both legs.  She has been unable to initiate physical therapy due to other health issues.  She takes Tylenol on a regular basis, with occasional ibuprofen.  No other prescription medications.  She has been using Salonpas patches.  These are expensive, and she was hoping to get a prescription.  Review of Systems: No fevers or chills No numbness or tingling No chest pain No shortness of breath No bowel or bladder dysfunction No GI distress No headaches      Objective: There were no vitals taken for this visit.  Physical Exam:  General: Alert and oriented. and No acute distress.  She is hard of hearing, and it is difficult to communicate. Gait: Normal gait.  Lower back without obvious deformity.  No swelling.  No bruising.  She is point tender in the lower portion of the lumbar spine, in the middle.  Mild tenderness to palpation the paraspinal muscles.  Negative straight leg raise bilaterally.  She ambulates with a slightly crouched gait.  IMAGING: I personally ordered and reviewed the following images   No new imaging obtained  today.  New Medications:  Meds ordered this encounter  Medications   diclofenac (VOLTAREN) 50 MG EC tablet    Sig: Take 1 tablet (50 mg total) by mouth 2 (two) times daily.    Dispense:  60 tablet    Refill:  0      Mordecai Rasmussen, MD  05/06/2022 10:39 AM

## 2022-05-06 NOTE — Patient Instructions (Addendum)
I will send your MRI order to [facility] after I have it approved. They will call you to schedule.  I will send the order for PT to [facility]

## 2022-05-09 ENCOUNTER — Encounter: Payer: Self-pay | Admitting: Radiology

## 2022-05-14 ENCOUNTER — Telehealth: Payer: Self-pay | Admitting: Orthopedic Surgery

## 2022-05-14 NOTE — Telephone Encounter (Signed)
Brenda Love w/[facility] [phone] called in regards to an authorization for a referral for lumbar spine with and w/out contrast.

## 2022-05-15 ENCOUNTER — Other Ambulatory Visit: Payer: Self-pay | Admitting: Family Medicine

## 2022-05-15 DIAGNOSIS — R928 Other abnormal and inconclusive findings on diagnostic imaging of breast: Secondary | ICD-10-CM

## 2022-05-15 DIAGNOSIS — R921 Mammographic calcification found on diagnostic imaging of breast: Secondary | ICD-10-CM | POA: Diagnosis not present

## 2022-05-15 NOTE — Telephone Encounter (Signed)
Called to assist with information, told they'd already gotten what they needed from [facility].

## 2022-05-17 DIAGNOSIS — M5416 Radiculopathy, lumbar region: Secondary | ICD-10-CM | POA: Diagnosis not present

## 2022-05-17 DIAGNOSIS — M4726 Other spondylosis with radiculopathy, lumbar region: Secondary | ICD-10-CM | POA: Diagnosis not present

## 2022-05-17 DIAGNOSIS — M5126 Other intervertebral disc displacement, lumbar region: Secondary | ICD-10-CM | POA: Diagnosis not present

## 2022-05-20 ENCOUNTER — Encounter: Payer: Self-pay | Admitting: Orthopedic Surgery

## 2022-05-22 DIAGNOSIS — H9193 Unspecified hearing loss, bilateral: Secondary | ICD-10-CM | POA: Diagnosis not present

## 2022-05-22 DIAGNOSIS — J020 Streptococcal pharyngitis: Secondary | ICD-10-CM | POA: Diagnosis not present

## 2022-05-22 DIAGNOSIS — Z299 Encounter for prophylactic measures, unspecified: Secondary | ICD-10-CM | POA: Diagnosis not present

## 2022-05-29 ENCOUNTER — Other Ambulatory Visit: Payer: Self-pay | Admitting: Family Medicine

## 2022-05-29 ENCOUNTER — Ambulatory Visit
Admission: RE | Admit: 2022-05-29 | Discharge: 2022-05-29 | Disposition: A | Discharge status: Home / Self Care | Payer: Medicare HMO | Source: Ambulatory Visit | Attending: Family Medicine | Admitting: Family Medicine

## 2022-05-29 DIAGNOSIS — R928 Other abnormal and inconclusive findings on diagnostic imaging of breast: Secondary | ICD-10-CM

## 2022-05-29 DIAGNOSIS — R921 Mammographic calcification found on diagnostic imaging of breast: Secondary | ICD-10-CM | POA: Diagnosis not present

## 2022-05-29 DIAGNOSIS — D0512 Intraductal carcinoma in situ of left breast: Secondary | ICD-10-CM | POA: Diagnosis not present

## 2022-05-29 DIAGNOSIS — N6022 Fibroadenosis of left breast: Secondary | ICD-10-CM | POA: Diagnosis not present

## 2022-05-29 HISTORY — PX: BREAST BIOPSY: SHX20

## 2022-06-05 DIAGNOSIS — Z170 Estrogen receptor positive status [ER+]: Secondary | ICD-10-CM | POA: Diagnosis not present

## 2022-06-05 DIAGNOSIS — D0512 Intraductal carcinoma in situ of left breast: Secondary | ICD-10-CM | POA: Diagnosis not present

## 2022-06-26 DIAGNOSIS — R92323 Mammographic fibroglandular density, bilateral breasts: Secondary | ICD-10-CM | POA: Diagnosis not present

## 2022-06-26 DIAGNOSIS — R921 Mammographic calcification found on diagnostic imaging of breast: Secondary | ICD-10-CM | POA: Diagnosis not present

## 2022-06-26 DIAGNOSIS — C50912 Malignant neoplasm of unspecified site of left female breast: Secondary | ICD-10-CM | POA: Diagnosis not present

## 2022-06-28 DIAGNOSIS — D0512 Intraductal carcinoma in situ of left breast: Secondary | ICD-10-CM | POA: Diagnosis not present

## 2022-06-28 DIAGNOSIS — E669 Obesity, unspecified: Secondary | ICD-10-CM | POA: Diagnosis not present

## 2022-06-28 DIAGNOSIS — I10 Essential (primary) hypertension: Secondary | ICD-10-CM | POA: Diagnosis not present

## 2022-06-28 DIAGNOSIS — Z79899 Other long term (current) drug therapy: Secondary | ICD-10-CM | POA: Diagnosis not present

## 2022-06-28 DIAGNOSIS — Z170 Estrogen receptor positive status [ER+]: Secondary | ICD-10-CM | POA: Diagnosis not present

## 2022-06-28 DIAGNOSIS — C50912 Malignant neoplasm of unspecified site of left female breast: Secondary | ICD-10-CM | POA: Diagnosis not present

## 2022-07-24 DIAGNOSIS — Z170 Estrogen receptor positive status [ER+]: Secondary | ICD-10-CM | POA: Diagnosis not present

## 2022-07-24 DIAGNOSIS — H9190 Unspecified hearing loss, unspecified ear: Secondary | ICD-10-CM | POA: Diagnosis not present

## 2022-07-24 DIAGNOSIS — Z90710 Acquired absence of both cervix and uterus: Secondary | ICD-10-CM | POA: Diagnosis not present

## 2022-07-24 DIAGNOSIS — N644 Mastodynia: Secondary | ICD-10-CM | POA: Diagnosis not present

## 2022-07-24 DIAGNOSIS — Z800 Family history of malignant neoplasm of digestive organs: Secondary | ICD-10-CM | POA: Diagnosis not present

## 2022-07-24 DIAGNOSIS — E785 Hyperlipidemia, unspecified: Secondary | ICD-10-CM | POA: Diagnosis not present

## 2022-07-24 DIAGNOSIS — Z803 Family history of malignant neoplasm of breast: Secondary | ICD-10-CM | POA: Diagnosis not present

## 2022-07-24 DIAGNOSIS — D0512 Intraductal carcinoma in situ of left breast: Secondary | ICD-10-CM | POA: Diagnosis not present

## 2022-07-24 DIAGNOSIS — Z780 Asymptomatic menopausal state: Secondary | ICD-10-CM | POA: Diagnosis not present

## 2022-07-24 DIAGNOSIS — Z90722 Acquired absence of ovaries, bilateral: Secondary | ICD-10-CM | POA: Diagnosis not present

## 2022-07-24 DIAGNOSIS — Z510 Encounter for antineoplastic radiation therapy: Secondary | ICD-10-CM | POA: Diagnosis not present

## 2022-07-24 DIAGNOSIS — I10 Essential (primary) hypertension: Secondary | ICD-10-CM | POA: Diagnosis not present

## 2022-07-29 DIAGNOSIS — I10 Essential (primary) hypertension: Secondary | ICD-10-CM | POA: Diagnosis not present

## 2022-07-29 DIAGNOSIS — N644 Mastodynia: Secondary | ICD-10-CM | POA: Diagnosis not present

## 2022-07-29 DIAGNOSIS — Z780 Asymptomatic menopausal state: Secondary | ICD-10-CM | POA: Diagnosis not present

## 2022-07-29 DIAGNOSIS — Z510 Encounter for antineoplastic radiation therapy: Secondary | ICD-10-CM | POA: Diagnosis not present

## 2022-07-29 DIAGNOSIS — Z803 Family history of malignant neoplasm of breast: Secondary | ICD-10-CM | POA: Diagnosis not present

## 2022-07-29 DIAGNOSIS — E785 Hyperlipidemia, unspecified: Secondary | ICD-10-CM | POA: Diagnosis not present

## 2022-07-29 DIAGNOSIS — D0512 Intraductal carcinoma in situ of left breast: Secondary | ICD-10-CM | POA: Diagnosis not present

## 2022-07-29 DIAGNOSIS — H9190 Unspecified hearing loss, unspecified ear: Secondary | ICD-10-CM | POA: Diagnosis not present

## 2022-07-29 DIAGNOSIS — Z170 Estrogen receptor positive status [ER+]: Secondary | ICD-10-CM | POA: Diagnosis not present

## 2022-07-30 DIAGNOSIS — D0512 Intraductal carcinoma in situ of left breast: Secondary | ICD-10-CM | POA: Diagnosis not present

## 2022-07-30 DIAGNOSIS — Z170 Estrogen receptor positive status [ER+]: Secondary | ICD-10-CM | POA: Diagnosis not present

## 2022-07-30 DIAGNOSIS — Z803 Family history of malignant neoplasm of breast: Secondary | ICD-10-CM | POA: Diagnosis not present

## 2022-07-30 DIAGNOSIS — Z800 Family history of malignant neoplasm of digestive organs: Secondary | ICD-10-CM | POA: Diagnosis not present

## 2022-08-08 DIAGNOSIS — D0512 Intraductal carcinoma in situ of left breast: Secondary | ICD-10-CM | POA: Diagnosis not present

## 2022-08-08 DIAGNOSIS — Z803 Family history of malignant neoplasm of breast: Secondary | ICD-10-CM | POA: Diagnosis not present

## 2022-08-08 DIAGNOSIS — Z170 Estrogen receptor positive status [ER+]: Secondary | ICD-10-CM | POA: Diagnosis not present

## 2022-08-08 DIAGNOSIS — Z780 Asymptomatic menopausal state: Secondary | ICD-10-CM | POA: Diagnosis not present

## 2022-08-08 DIAGNOSIS — H9190 Unspecified hearing loss, unspecified ear: Secondary | ICD-10-CM | POA: Diagnosis not present

## 2022-08-08 DIAGNOSIS — I10 Essential (primary) hypertension: Secondary | ICD-10-CM | POA: Diagnosis not present

## 2022-08-08 DIAGNOSIS — N644 Mastodynia: Secondary | ICD-10-CM | POA: Diagnosis not present

## 2022-08-08 DIAGNOSIS — E785 Hyperlipidemia, unspecified: Secondary | ICD-10-CM | POA: Diagnosis not present

## 2022-08-08 DIAGNOSIS — Z510 Encounter for antineoplastic radiation therapy: Secondary | ICD-10-CM | POA: Diagnosis not present

## 2022-08-09 DIAGNOSIS — Z780 Asymptomatic menopausal state: Secondary | ICD-10-CM | POA: Diagnosis not present

## 2022-08-09 DIAGNOSIS — Z510 Encounter for antineoplastic radiation therapy: Secondary | ICD-10-CM | POA: Diagnosis not present

## 2022-08-09 DIAGNOSIS — E785 Hyperlipidemia, unspecified: Secondary | ICD-10-CM | POA: Diagnosis not present

## 2022-08-09 DIAGNOSIS — I10 Essential (primary) hypertension: Secondary | ICD-10-CM | POA: Diagnosis not present

## 2022-08-09 DIAGNOSIS — H9190 Unspecified hearing loss, unspecified ear: Secondary | ICD-10-CM | POA: Diagnosis not present

## 2022-08-09 DIAGNOSIS — D0512 Intraductal carcinoma in situ of left breast: Secondary | ICD-10-CM | POA: Diagnosis not present

## 2022-08-09 DIAGNOSIS — N644 Mastodynia: Secondary | ICD-10-CM | POA: Diagnosis not present

## 2022-08-09 DIAGNOSIS — Z803 Family history of malignant neoplasm of breast: Secondary | ICD-10-CM | POA: Diagnosis not present

## 2022-08-09 DIAGNOSIS — Z170 Estrogen receptor positive status [ER+]: Secondary | ICD-10-CM | POA: Diagnosis not present

## 2022-08-12 DIAGNOSIS — E785 Hyperlipidemia, unspecified: Secondary | ICD-10-CM | POA: Diagnosis not present

## 2022-08-12 DIAGNOSIS — I10 Essential (primary) hypertension: Secondary | ICD-10-CM | POA: Diagnosis not present

## 2022-08-12 DIAGNOSIS — Z510 Encounter for antineoplastic radiation therapy: Secondary | ICD-10-CM | POA: Diagnosis not present

## 2022-08-12 DIAGNOSIS — Z780 Asymptomatic menopausal state: Secondary | ICD-10-CM | POA: Diagnosis not present

## 2022-08-12 DIAGNOSIS — D0512 Intraductal carcinoma in situ of left breast: Secondary | ICD-10-CM | POA: Diagnosis not present

## 2022-08-12 DIAGNOSIS — Z803 Family history of malignant neoplasm of breast: Secondary | ICD-10-CM | POA: Diagnosis not present

## 2022-08-12 DIAGNOSIS — Z170 Estrogen receptor positive status [ER+]: Secondary | ICD-10-CM | POA: Diagnosis not present

## 2022-08-12 DIAGNOSIS — H9190 Unspecified hearing loss, unspecified ear: Secondary | ICD-10-CM | POA: Diagnosis not present

## 2022-08-12 DIAGNOSIS — N644 Mastodynia: Secondary | ICD-10-CM | POA: Diagnosis not present

## 2022-08-13 DIAGNOSIS — Z780 Asymptomatic menopausal state: Secondary | ICD-10-CM | POA: Diagnosis not present

## 2022-08-13 DIAGNOSIS — I10 Essential (primary) hypertension: Secondary | ICD-10-CM | POA: Diagnosis not present

## 2022-08-13 DIAGNOSIS — Z510 Encounter for antineoplastic radiation therapy: Secondary | ICD-10-CM | POA: Diagnosis not present

## 2022-08-13 DIAGNOSIS — Z803 Family history of malignant neoplasm of breast: Secondary | ICD-10-CM | POA: Diagnosis not present

## 2022-08-13 DIAGNOSIS — N644 Mastodynia: Secondary | ICD-10-CM | POA: Diagnosis not present

## 2022-08-13 DIAGNOSIS — E785 Hyperlipidemia, unspecified: Secondary | ICD-10-CM | POA: Diagnosis not present

## 2022-08-13 DIAGNOSIS — H9190 Unspecified hearing loss, unspecified ear: Secondary | ICD-10-CM | POA: Diagnosis not present

## 2022-08-13 DIAGNOSIS — D0512 Intraductal carcinoma in situ of left breast: Secondary | ICD-10-CM | POA: Diagnosis not present

## 2022-08-13 DIAGNOSIS — Z170 Estrogen receptor positive status [ER+]: Secondary | ICD-10-CM | POA: Diagnosis not present

## 2022-08-14 DIAGNOSIS — Z803 Family history of malignant neoplasm of breast: Secondary | ICD-10-CM | POA: Diagnosis not present

## 2022-08-14 DIAGNOSIS — Z780 Asymptomatic menopausal state: Secondary | ICD-10-CM | POA: Diagnosis not present

## 2022-08-14 DIAGNOSIS — I10 Essential (primary) hypertension: Secondary | ICD-10-CM | POA: Diagnosis not present

## 2022-08-14 DIAGNOSIS — Z510 Encounter for antineoplastic radiation therapy: Secondary | ICD-10-CM | POA: Diagnosis not present

## 2022-08-14 DIAGNOSIS — Z170 Estrogen receptor positive status [ER+]: Secondary | ICD-10-CM | POA: Diagnosis not present

## 2022-08-14 DIAGNOSIS — N644 Mastodynia: Secondary | ICD-10-CM | POA: Diagnosis not present

## 2022-08-14 DIAGNOSIS — E785 Hyperlipidemia, unspecified: Secondary | ICD-10-CM | POA: Diagnosis not present

## 2022-08-14 DIAGNOSIS — D0512 Intraductal carcinoma in situ of left breast: Secondary | ICD-10-CM | POA: Diagnosis not present

## 2022-08-14 DIAGNOSIS — H9190 Unspecified hearing loss, unspecified ear: Secondary | ICD-10-CM | POA: Diagnosis not present

## 2022-08-15 DIAGNOSIS — Z780 Asymptomatic menopausal state: Secondary | ICD-10-CM | POA: Diagnosis not present

## 2022-08-15 DIAGNOSIS — I10 Essential (primary) hypertension: Secondary | ICD-10-CM | POA: Diagnosis not present

## 2022-08-15 DIAGNOSIS — Z510 Encounter for antineoplastic radiation therapy: Secondary | ICD-10-CM | POA: Diagnosis not present

## 2022-08-15 DIAGNOSIS — H9190 Unspecified hearing loss, unspecified ear: Secondary | ICD-10-CM | POA: Diagnosis not present

## 2022-08-15 DIAGNOSIS — Z803 Family history of malignant neoplasm of breast: Secondary | ICD-10-CM | POA: Diagnosis not present

## 2022-08-15 DIAGNOSIS — E785 Hyperlipidemia, unspecified: Secondary | ICD-10-CM | POA: Diagnosis not present

## 2022-08-15 DIAGNOSIS — N644 Mastodynia: Secondary | ICD-10-CM | POA: Diagnosis not present

## 2022-08-15 DIAGNOSIS — Z170 Estrogen receptor positive status [ER+]: Secondary | ICD-10-CM | POA: Diagnosis not present

## 2022-08-15 DIAGNOSIS — D0512 Intraductal carcinoma in situ of left breast: Secondary | ICD-10-CM | POA: Diagnosis not present

## 2022-08-16 DIAGNOSIS — I10 Essential (primary) hypertension: Secondary | ICD-10-CM | POA: Diagnosis not present

## 2022-08-16 DIAGNOSIS — D0512 Intraductal carcinoma in situ of left breast: Secondary | ICD-10-CM | POA: Diagnosis not present

## 2022-08-16 DIAGNOSIS — N644 Mastodynia: Secondary | ICD-10-CM | POA: Diagnosis not present

## 2022-08-16 DIAGNOSIS — Z170 Estrogen receptor positive status [ER+]: Secondary | ICD-10-CM | POA: Diagnosis not present

## 2022-08-16 DIAGNOSIS — Z803 Family history of malignant neoplasm of breast: Secondary | ICD-10-CM | POA: Diagnosis not present

## 2022-08-16 DIAGNOSIS — H9190 Unspecified hearing loss, unspecified ear: Secondary | ICD-10-CM | POA: Diagnosis not present

## 2022-08-16 DIAGNOSIS — Z510 Encounter for antineoplastic radiation therapy: Secondary | ICD-10-CM | POA: Diagnosis not present

## 2022-08-16 DIAGNOSIS — Z780 Asymptomatic menopausal state: Secondary | ICD-10-CM | POA: Diagnosis not present

## 2022-08-16 DIAGNOSIS — E785 Hyperlipidemia, unspecified: Secondary | ICD-10-CM | POA: Diagnosis not present

## 2022-08-19 DIAGNOSIS — Z803 Family history of malignant neoplasm of breast: Secondary | ICD-10-CM | POA: Diagnosis not present

## 2022-08-19 DIAGNOSIS — N644 Mastodynia: Secondary | ICD-10-CM | POA: Diagnosis not present

## 2022-08-19 DIAGNOSIS — H9190 Unspecified hearing loss, unspecified ear: Secondary | ICD-10-CM | POA: Diagnosis not present

## 2022-08-19 DIAGNOSIS — Z510 Encounter for antineoplastic radiation therapy: Secondary | ICD-10-CM | POA: Diagnosis not present

## 2022-08-19 DIAGNOSIS — Z780 Asymptomatic menopausal state: Secondary | ICD-10-CM | POA: Diagnosis not present

## 2022-08-19 DIAGNOSIS — E785 Hyperlipidemia, unspecified: Secondary | ICD-10-CM | POA: Diagnosis not present

## 2022-08-19 DIAGNOSIS — I10 Essential (primary) hypertension: Secondary | ICD-10-CM | POA: Diagnosis not present

## 2022-08-19 DIAGNOSIS — Z170 Estrogen receptor positive status [ER+]: Secondary | ICD-10-CM | POA: Diagnosis not present

## 2022-08-19 DIAGNOSIS — D0512 Intraductal carcinoma in situ of left breast: Secondary | ICD-10-CM | POA: Diagnosis not present

## 2022-08-20 DIAGNOSIS — Z780 Asymptomatic menopausal state: Secondary | ICD-10-CM | POA: Diagnosis not present

## 2022-08-20 DIAGNOSIS — Z170 Estrogen receptor positive status [ER+]: Secondary | ICD-10-CM | POA: Diagnosis not present

## 2022-08-20 DIAGNOSIS — I10 Essential (primary) hypertension: Secondary | ICD-10-CM | POA: Diagnosis not present

## 2022-08-20 DIAGNOSIS — H9190 Unspecified hearing loss, unspecified ear: Secondary | ICD-10-CM | POA: Diagnosis not present

## 2022-08-20 DIAGNOSIS — D0512 Intraductal carcinoma in situ of left breast: Secondary | ICD-10-CM | POA: Diagnosis not present

## 2022-08-20 DIAGNOSIS — E785 Hyperlipidemia, unspecified: Secondary | ICD-10-CM | POA: Diagnosis not present

## 2022-08-20 DIAGNOSIS — Z803 Family history of malignant neoplasm of breast: Secondary | ICD-10-CM | POA: Diagnosis not present

## 2022-08-20 DIAGNOSIS — N644 Mastodynia: Secondary | ICD-10-CM | POA: Diagnosis not present

## 2022-08-20 DIAGNOSIS — Z510 Encounter for antineoplastic radiation therapy: Secondary | ICD-10-CM | POA: Diagnosis not present

## 2022-08-21 DIAGNOSIS — N644 Mastodynia: Secondary | ICD-10-CM | POA: Diagnosis not present

## 2022-08-21 DIAGNOSIS — Z510 Encounter for antineoplastic radiation therapy: Secondary | ICD-10-CM | POA: Diagnosis not present

## 2022-08-21 DIAGNOSIS — H9190 Unspecified hearing loss, unspecified ear: Secondary | ICD-10-CM | POA: Diagnosis not present

## 2022-08-21 DIAGNOSIS — Z170 Estrogen receptor positive status [ER+]: Secondary | ICD-10-CM | POA: Diagnosis not present

## 2022-08-21 DIAGNOSIS — D0512 Intraductal carcinoma in situ of left breast: Secondary | ICD-10-CM | POA: Diagnosis not present

## 2022-08-21 DIAGNOSIS — Z780 Asymptomatic menopausal state: Secondary | ICD-10-CM | POA: Diagnosis not present

## 2022-08-21 DIAGNOSIS — E785 Hyperlipidemia, unspecified: Secondary | ICD-10-CM | POA: Diagnosis not present

## 2022-08-21 DIAGNOSIS — Z803 Family history of malignant neoplasm of breast: Secondary | ICD-10-CM | POA: Diagnosis not present

## 2022-08-21 DIAGNOSIS — I10 Essential (primary) hypertension: Secondary | ICD-10-CM | POA: Diagnosis not present

## 2022-08-22 DIAGNOSIS — Z780 Asymptomatic menopausal state: Secondary | ICD-10-CM | POA: Diagnosis not present

## 2022-08-22 DIAGNOSIS — N644 Mastodynia: Secondary | ICD-10-CM | POA: Diagnosis not present

## 2022-08-22 DIAGNOSIS — H9190 Unspecified hearing loss, unspecified ear: Secondary | ICD-10-CM | POA: Diagnosis not present

## 2022-08-22 DIAGNOSIS — Z510 Encounter for antineoplastic radiation therapy: Secondary | ICD-10-CM | POA: Diagnosis not present

## 2022-08-22 DIAGNOSIS — D0512 Intraductal carcinoma in situ of left breast: Secondary | ICD-10-CM | POA: Diagnosis not present

## 2022-08-22 DIAGNOSIS — E785 Hyperlipidemia, unspecified: Secondary | ICD-10-CM | POA: Diagnosis not present

## 2022-08-22 DIAGNOSIS — Z170 Estrogen receptor positive status [ER+]: Secondary | ICD-10-CM | POA: Diagnosis not present

## 2022-08-22 DIAGNOSIS — Z803 Family history of malignant neoplasm of breast: Secondary | ICD-10-CM | POA: Diagnosis not present

## 2022-08-22 DIAGNOSIS — I10 Essential (primary) hypertension: Secondary | ICD-10-CM | POA: Diagnosis not present

## 2022-08-23 DIAGNOSIS — Z780 Asymptomatic menopausal state: Secondary | ICD-10-CM | POA: Diagnosis not present

## 2022-08-23 DIAGNOSIS — I10 Essential (primary) hypertension: Secondary | ICD-10-CM | POA: Diagnosis not present

## 2022-08-23 DIAGNOSIS — D0512 Intraductal carcinoma in situ of left breast: Secondary | ICD-10-CM | POA: Diagnosis not present

## 2022-08-23 DIAGNOSIS — Z510 Encounter for antineoplastic radiation therapy: Secondary | ICD-10-CM | POA: Diagnosis not present

## 2022-08-23 DIAGNOSIS — N644 Mastodynia: Secondary | ICD-10-CM | POA: Diagnosis not present

## 2022-08-23 DIAGNOSIS — E785 Hyperlipidemia, unspecified: Secondary | ICD-10-CM | POA: Diagnosis not present

## 2022-08-23 DIAGNOSIS — Z170 Estrogen receptor positive status [ER+]: Secondary | ICD-10-CM | POA: Diagnosis not present

## 2022-08-23 DIAGNOSIS — Z803 Family history of malignant neoplasm of breast: Secondary | ICD-10-CM | POA: Diagnosis not present

## 2022-08-23 DIAGNOSIS — H9190 Unspecified hearing loss, unspecified ear: Secondary | ICD-10-CM | POA: Diagnosis not present

## 2022-08-26 DIAGNOSIS — I10 Essential (primary) hypertension: Secondary | ICD-10-CM | POA: Diagnosis not present

## 2022-08-26 DIAGNOSIS — Z803 Family history of malignant neoplasm of breast: Secondary | ICD-10-CM | POA: Diagnosis not present

## 2022-08-26 DIAGNOSIS — Z510 Encounter for antineoplastic radiation therapy: Secondary | ICD-10-CM | POA: Diagnosis not present

## 2022-08-26 DIAGNOSIS — D0512 Intraductal carcinoma in situ of left breast: Secondary | ICD-10-CM | POA: Diagnosis not present

## 2022-08-26 DIAGNOSIS — E785 Hyperlipidemia, unspecified: Secondary | ICD-10-CM | POA: Diagnosis not present

## 2022-08-26 DIAGNOSIS — Z780 Asymptomatic menopausal state: Secondary | ICD-10-CM | POA: Diagnosis not present

## 2022-08-26 DIAGNOSIS — N644 Mastodynia: Secondary | ICD-10-CM | POA: Diagnosis not present

## 2022-08-26 DIAGNOSIS — H9190 Unspecified hearing loss, unspecified ear: Secondary | ICD-10-CM | POA: Diagnosis not present

## 2022-08-26 DIAGNOSIS — Z170 Estrogen receptor positive status [ER+]: Secondary | ICD-10-CM | POA: Diagnosis not present

## 2022-08-27 DIAGNOSIS — N644 Mastodynia: Secondary | ICD-10-CM | POA: Diagnosis not present

## 2022-08-27 DIAGNOSIS — Z510 Encounter for antineoplastic radiation therapy: Secondary | ICD-10-CM | POA: Diagnosis not present

## 2022-08-27 DIAGNOSIS — Z170 Estrogen receptor positive status [ER+]: Secondary | ICD-10-CM | POA: Diagnosis not present

## 2022-08-27 DIAGNOSIS — E785 Hyperlipidemia, unspecified: Secondary | ICD-10-CM | POA: Diagnosis not present

## 2022-08-27 DIAGNOSIS — I10 Essential (primary) hypertension: Secondary | ICD-10-CM | POA: Diagnosis not present

## 2022-08-27 DIAGNOSIS — D0512 Intraductal carcinoma in situ of left breast: Secondary | ICD-10-CM | POA: Diagnosis not present

## 2022-08-27 DIAGNOSIS — Z780 Asymptomatic menopausal state: Secondary | ICD-10-CM | POA: Diagnosis not present

## 2022-08-27 DIAGNOSIS — Z803 Family history of malignant neoplasm of breast: Secondary | ICD-10-CM | POA: Diagnosis not present

## 2022-08-27 DIAGNOSIS — H9190 Unspecified hearing loss, unspecified ear: Secondary | ICD-10-CM | POA: Diagnosis not present

## 2022-08-28 DIAGNOSIS — D0512 Intraductal carcinoma in situ of left breast: Secondary | ICD-10-CM | POA: Diagnosis not present

## 2022-08-28 DIAGNOSIS — N644 Mastodynia: Secondary | ICD-10-CM | POA: Diagnosis not present

## 2022-08-28 DIAGNOSIS — Z780 Asymptomatic menopausal state: Secondary | ICD-10-CM | POA: Diagnosis not present

## 2022-08-28 DIAGNOSIS — I10 Essential (primary) hypertension: Secondary | ICD-10-CM | POA: Diagnosis not present

## 2022-08-28 DIAGNOSIS — H9190 Unspecified hearing loss, unspecified ear: Secondary | ICD-10-CM | POA: Diagnosis not present

## 2022-08-28 DIAGNOSIS — Z170 Estrogen receptor positive status [ER+]: Secondary | ICD-10-CM | POA: Diagnosis not present

## 2022-08-28 DIAGNOSIS — E785 Hyperlipidemia, unspecified: Secondary | ICD-10-CM | POA: Diagnosis not present

## 2022-08-28 DIAGNOSIS — Z510 Encounter for antineoplastic radiation therapy: Secondary | ICD-10-CM | POA: Diagnosis not present

## 2022-08-28 DIAGNOSIS — Z803 Family history of malignant neoplasm of breast: Secondary | ICD-10-CM | POA: Diagnosis not present

## 2022-08-29 DIAGNOSIS — E785 Hyperlipidemia, unspecified: Secondary | ICD-10-CM | POA: Diagnosis not present

## 2022-08-29 DIAGNOSIS — Z510 Encounter for antineoplastic radiation therapy: Secondary | ICD-10-CM | POA: Diagnosis not present

## 2022-08-29 DIAGNOSIS — I10 Essential (primary) hypertension: Secondary | ICD-10-CM | POA: Diagnosis not present

## 2022-08-29 DIAGNOSIS — Z170 Estrogen receptor positive status [ER+]: Secondary | ICD-10-CM | POA: Diagnosis not present

## 2022-08-29 DIAGNOSIS — N644 Mastodynia: Secondary | ICD-10-CM | POA: Diagnosis not present

## 2022-08-29 DIAGNOSIS — Z780 Asymptomatic menopausal state: Secondary | ICD-10-CM | POA: Diagnosis not present

## 2022-08-29 DIAGNOSIS — D0512 Intraductal carcinoma in situ of left breast: Secondary | ICD-10-CM | POA: Diagnosis not present

## 2022-08-29 DIAGNOSIS — H9190 Unspecified hearing loss, unspecified ear: Secondary | ICD-10-CM | POA: Diagnosis not present

## 2022-08-29 DIAGNOSIS — Z803 Family history of malignant neoplasm of breast: Secondary | ICD-10-CM | POA: Diagnosis not present

## 2022-08-30 DIAGNOSIS — N644 Mastodynia: Secondary | ICD-10-CM | POA: Diagnosis not present

## 2022-08-30 DIAGNOSIS — H9190 Unspecified hearing loss, unspecified ear: Secondary | ICD-10-CM | POA: Diagnosis not present

## 2022-08-30 DIAGNOSIS — Z170 Estrogen receptor positive status [ER+]: Secondary | ICD-10-CM | POA: Diagnosis not present

## 2022-08-30 DIAGNOSIS — E785 Hyperlipidemia, unspecified: Secondary | ICD-10-CM | POA: Diagnosis not present

## 2022-08-30 DIAGNOSIS — Z510 Encounter for antineoplastic radiation therapy: Secondary | ICD-10-CM | POA: Diagnosis not present

## 2022-08-30 DIAGNOSIS — I10 Essential (primary) hypertension: Secondary | ICD-10-CM | POA: Diagnosis not present

## 2022-08-30 DIAGNOSIS — Z803 Family history of malignant neoplasm of breast: Secondary | ICD-10-CM | POA: Diagnosis not present

## 2022-08-30 DIAGNOSIS — Z780 Asymptomatic menopausal state: Secondary | ICD-10-CM | POA: Diagnosis not present

## 2022-08-30 DIAGNOSIS — D0512 Intraductal carcinoma in situ of left breast: Secondary | ICD-10-CM | POA: Diagnosis not present

## 2022-09-02 DIAGNOSIS — Z780 Asymptomatic menopausal state: Secondary | ICD-10-CM | POA: Diagnosis not present

## 2022-09-02 DIAGNOSIS — D0512 Intraductal carcinoma in situ of left breast: Secondary | ICD-10-CM | POA: Diagnosis not present

## 2022-09-02 DIAGNOSIS — N644 Mastodynia: Secondary | ICD-10-CM | POA: Diagnosis not present

## 2022-09-02 DIAGNOSIS — E785 Hyperlipidemia, unspecified: Secondary | ICD-10-CM | POA: Diagnosis not present

## 2022-09-02 DIAGNOSIS — Z803 Family history of malignant neoplasm of breast: Secondary | ICD-10-CM | POA: Diagnosis not present

## 2022-09-02 DIAGNOSIS — Z510 Encounter for antineoplastic radiation therapy: Secondary | ICD-10-CM | POA: Diagnosis not present

## 2022-09-02 DIAGNOSIS — H9190 Unspecified hearing loss, unspecified ear: Secondary | ICD-10-CM | POA: Diagnosis not present

## 2022-09-02 DIAGNOSIS — Z170 Estrogen receptor positive status [ER+]: Secondary | ICD-10-CM | POA: Diagnosis not present

## 2022-09-02 DIAGNOSIS — I10 Essential (primary) hypertension: Secondary | ICD-10-CM | POA: Diagnosis not present

## 2022-09-03 DIAGNOSIS — Z510 Encounter for antineoplastic radiation therapy: Secondary | ICD-10-CM | POA: Diagnosis not present

## 2022-09-03 DIAGNOSIS — Z170 Estrogen receptor positive status [ER+]: Secondary | ICD-10-CM | POA: Diagnosis not present

## 2022-09-03 DIAGNOSIS — N644 Mastodynia: Secondary | ICD-10-CM | POA: Diagnosis not present

## 2022-09-03 DIAGNOSIS — Z780 Asymptomatic menopausal state: Secondary | ICD-10-CM | POA: Diagnosis not present

## 2022-09-03 DIAGNOSIS — Z803 Family history of malignant neoplasm of breast: Secondary | ICD-10-CM | POA: Diagnosis not present

## 2022-09-03 DIAGNOSIS — I10 Essential (primary) hypertension: Secondary | ICD-10-CM | POA: Diagnosis not present

## 2022-09-03 DIAGNOSIS — D0512 Intraductal carcinoma in situ of left breast: Secondary | ICD-10-CM | POA: Diagnosis not present

## 2022-09-03 DIAGNOSIS — E785 Hyperlipidemia, unspecified: Secondary | ICD-10-CM | POA: Diagnosis not present

## 2022-09-03 DIAGNOSIS — H9190 Unspecified hearing loss, unspecified ear: Secondary | ICD-10-CM | POA: Diagnosis not present

## 2022-09-04 DIAGNOSIS — Z780 Asymptomatic menopausal state: Secondary | ICD-10-CM | POA: Diagnosis not present

## 2022-09-04 DIAGNOSIS — Z803 Family history of malignant neoplasm of breast: Secondary | ICD-10-CM | POA: Diagnosis not present

## 2022-09-04 DIAGNOSIS — E785 Hyperlipidemia, unspecified: Secondary | ICD-10-CM | POA: Diagnosis not present

## 2022-09-04 DIAGNOSIS — Z510 Encounter for antineoplastic radiation therapy: Secondary | ICD-10-CM | POA: Diagnosis not present

## 2022-09-04 DIAGNOSIS — H9190 Unspecified hearing loss, unspecified ear: Secondary | ICD-10-CM | POA: Diagnosis not present

## 2022-09-04 DIAGNOSIS — I10 Essential (primary) hypertension: Secondary | ICD-10-CM | POA: Diagnosis not present

## 2022-09-04 DIAGNOSIS — D0512 Intraductal carcinoma in situ of left breast: Secondary | ICD-10-CM | POA: Diagnosis not present

## 2022-09-04 DIAGNOSIS — N644 Mastodynia: Secondary | ICD-10-CM | POA: Diagnosis not present

## 2022-09-04 DIAGNOSIS — Z170 Estrogen receptor positive status [ER+]: Secondary | ICD-10-CM | POA: Diagnosis not present

## 2022-09-05 DIAGNOSIS — N644 Mastodynia: Secondary | ICD-10-CM | POA: Diagnosis not present

## 2022-09-05 DIAGNOSIS — Z780 Asymptomatic menopausal state: Secondary | ICD-10-CM | POA: Diagnosis not present

## 2022-09-05 DIAGNOSIS — Z510 Encounter for antineoplastic radiation therapy: Secondary | ICD-10-CM | POA: Diagnosis not present

## 2022-09-05 DIAGNOSIS — H9190 Unspecified hearing loss, unspecified ear: Secondary | ICD-10-CM | POA: Diagnosis not present

## 2022-09-05 DIAGNOSIS — Z803 Family history of malignant neoplasm of breast: Secondary | ICD-10-CM | POA: Diagnosis not present

## 2022-09-05 DIAGNOSIS — D0512 Intraductal carcinoma in situ of left breast: Secondary | ICD-10-CM | POA: Diagnosis not present

## 2022-09-05 DIAGNOSIS — Z170 Estrogen receptor positive status [ER+]: Secondary | ICD-10-CM | POA: Diagnosis not present

## 2022-09-05 DIAGNOSIS — E785 Hyperlipidemia, unspecified: Secondary | ICD-10-CM | POA: Diagnosis not present

## 2022-09-05 DIAGNOSIS — I10 Essential (primary) hypertension: Secondary | ICD-10-CM | POA: Diagnosis not present

## 2022-09-06 DIAGNOSIS — Z803 Family history of malignant neoplasm of breast: Secondary | ICD-10-CM | POA: Diagnosis not present

## 2022-09-06 DIAGNOSIS — N644 Mastodynia: Secondary | ICD-10-CM | POA: Diagnosis not present

## 2022-09-06 DIAGNOSIS — Z170 Estrogen receptor positive status [ER+]: Secondary | ICD-10-CM | POA: Diagnosis not present

## 2022-09-06 DIAGNOSIS — Z780 Asymptomatic menopausal state: Secondary | ICD-10-CM | POA: Diagnosis not present

## 2022-09-06 DIAGNOSIS — Z510 Encounter for antineoplastic radiation therapy: Secondary | ICD-10-CM | POA: Diagnosis not present

## 2022-09-06 DIAGNOSIS — E785 Hyperlipidemia, unspecified: Secondary | ICD-10-CM | POA: Diagnosis not present

## 2022-09-06 DIAGNOSIS — H9190 Unspecified hearing loss, unspecified ear: Secondary | ICD-10-CM | POA: Diagnosis not present

## 2022-09-06 DIAGNOSIS — D0512 Intraductal carcinoma in situ of left breast: Secondary | ICD-10-CM | POA: Diagnosis not present

## 2022-09-06 DIAGNOSIS — I10 Essential (primary) hypertension: Secondary | ICD-10-CM | POA: Diagnosis not present

## 2022-09-09 DIAGNOSIS — Z510 Encounter for antineoplastic radiation therapy: Secondary | ICD-10-CM | POA: Diagnosis not present

## 2022-09-09 DIAGNOSIS — Z170 Estrogen receptor positive status [ER+]: Secondary | ICD-10-CM | POA: Diagnosis not present

## 2022-09-09 DIAGNOSIS — Z803 Family history of malignant neoplasm of breast: Secondary | ICD-10-CM | POA: Diagnosis not present

## 2022-09-09 DIAGNOSIS — N644 Mastodynia: Secondary | ICD-10-CM | POA: Diagnosis not present

## 2022-09-09 DIAGNOSIS — H9190 Unspecified hearing loss, unspecified ear: Secondary | ICD-10-CM | POA: Diagnosis not present

## 2022-09-09 DIAGNOSIS — I10 Essential (primary) hypertension: Secondary | ICD-10-CM | POA: Diagnosis not present

## 2022-09-09 DIAGNOSIS — E785 Hyperlipidemia, unspecified: Secondary | ICD-10-CM | POA: Diagnosis not present

## 2022-09-09 DIAGNOSIS — Z780 Asymptomatic menopausal state: Secondary | ICD-10-CM | POA: Diagnosis not present

## 2022-09-09 DIAGNOSIS — D0512 Intraductal carcinoma in situ of left breast: Secondary | ICD-10-CM | POA: Diagnosis not present

## 2022-10-08 DIAGNOSIS — D0512 Intraductal carcinoma in situ of left breast: Secondary | ICD-10-CM | POA: Diagnosis not present

## 2022-10-08 DIAGNOSIS — Z09 Encounter for follow-up examination after completed treatment for conditions other than malignant neoplasm: Secondary | ICD-10-CM | POA: Diagnosis not present

## 2022-10-16 DIAGNOSIS — M26649 Arthritis of unspecified temporomandibular joint: Secondary | ICD-10-CM | POA: Diagnosis not present

## 2022-10-16 DIAGNOSIS — C50919 Malignant neoplasm of unspecified site of unspecified female breast: Secondary | ICD-10-CM | POA: Diagnosis not present

## 2022-10-16 DIAGNOSIS — Z299 Encounter for prophylactic measures, unspecified: Secondary | ICD-10-CM | POA: Diagnosis not present

## 2022-10-16 DIAGNOSIS — I10 Essential (primary) hypertension: Secondary | ICD-10-CM | POA: Diagnosis not present

## 2022-10-16 DIAGNOSIS — R5383 Other fatigue: Secondary | ICD-10-CM | POA: Diagnosis not present

## 2022-10-24 ENCOUNTER — Encounter: Payer: Self-pay | Admitting: *Deleted

## 2022-10-29 ENCOUNTER — Telehealth: Payer: Self-pay | Admitting: Internal Medicine

## 2022-10-29 DIAGNOSIS — Z1339 Encounter for screening examination for other mental health and behavioral disorders: Secondary | ICD-10-CM | POA: Diagnosis not present

## 2022-10-29 DIAGNOSIS — Z79899 Other long term (current) drug therapy: Secondary | ICD-10-CM | POA: Diagnosis not present

## 2022-10-29 DIAGNOSIS — E7800 Pure hypercholesterolemia, unspecified: Secondary | ICD-10-CM | POA: Diagnosis not present

## 2022-10-29 DIAGNOSIS — I10 Essential (primary) hypertension: Secondary | ICD-10-CM | POA: Diagnosis not present

## 2022-10-29 DIAGNOSIS — Z7189 Other specified counseling: Secondary | ICD-10-CM | POA: Diagnosis not present

## 2022-10-29 DIAGNOSIS — Z0000 Encounter for general adult medical examination without abnormal findings: Secondary | ICD-10-CM | POA: Diagnosis not present

## 2022-10-29 DIAGNOSIS — R5383 Other fatigue: Secondary | ICD-10-CM | POA: Diagnosis not present

## 2022-10-29 DIAGNOSIS — Z299 Encounter for prophylactic measures, unspecified: Secondary | ICD-10-CM | POA: Diagnosis not present

## 2022-10-29 DIAGNOSIS — Z1331 Encounter for screening for depression: Secondary | ICD-10-CM | POA: Diagnosis not present

## 2022-10-29 NOTE — Telephone Encounter (Signed)
Questionnaire is in review.

## 2022-10-30 NOTE — Telephone Encounter (Signed)
  Procedure: Colonoscopy  Height: 5'3 Weight: 187lbs        Have you had a colonoscopy before?  04/29/17, Dr. Darrick Penna  Do you have family history of colon cancer?  Yes mother  Do you have a family history of polyps? unsure  Previous colonoscopy with polyps removed? yes  Do you have a history colorectal cancer?   no  Are you diabetic?  no  Do you have a prosthetic or mechanical heart valve? no  Do you have a pacemaker/defibrillator?   no  Have you had endocarditis/atrial fibrillation?  no  Do you use supplemental oxygen/CPAP?  no  Have you had joint replacement within the last 12 months?  no  Do you tend to be constipated or have to use laxatives?  no   Do you have history of alcohol use? If yes, how much and how often.  no  Do you have history or are you using drugs? If yes, what do are you  using?  no  Have you ever had a stroke/heart attack?  no  Have you ever had a heart or other vascular stent placed,?no  Do you take weight loss medication? no  female patients,: have you had a hysterectomy? yes                              are you post menopausal?  yes                              do you still have your menstrual cycle? no    Date of last menstrual period? 1982  Do you take any blood-thinning medications such as: (Plavix, aspirin, Coumadin, Aggrenox, Brilinta, Xarelto, Eliquis, Pradaxa, Savaysa or Effient)? no  If yes we need the name, milligram, dosage and who is prescribing doctor:               Current Outpatient Medications  Medication Sig Dispense Refill   anastrozole (ARIMIDEX) 1 MG tablet Take 1 mg by mouth daily.     Calcium Carb-Cholecalciferol (CALCIUM 600 + D PO) Take by mouth 2 (two) times daily at 8 am and 10 pm.     metoprolol (LOPRESSOR) 50 MG tablet Take 25-50 mg by mouth 2 (two) times daily. Take 1 tablet (50 mg) by mouth in the morning & 0.5 tablet (25 mg) in the evening.     omeprazole (PRILOSEC) 20 MG capsule Take 1 capsule (20 mg total) by  mouth daily. (Patient taking differently: Take 20 mg by mouth daily before breakfast.) 30 capsule 5   No current facility-administered medications for this visit.    Allergies  Allergen Reactions   Codeine Hives and Nausea And Vomiting   Sulfa Antibiotics Nausea And Vomiting   Sulfacetamide Sodium Nausea And Vomiting

## 2022-10-30 NOTE — Addendum Note (Signed)
Addended by: Armstead Peaks on: 10/30/2022 02:19 PM   Modules accepted: Orders

## 2022-11-19 NOTE — Telephone Encounter (Signed)
Needs OV, okay to schedule with me, Dx microscopic colitis

## 2022-11-20 NOTE — Telephone Encounter (Signed)
Referrl closed

## 2022-12-04 ENCOUNTER — Telehealth: Payer: Self-pay | Admitting: Internal Medicine

## 2022-12-04 NOTE — Telephone Encounter (Signed)
Questionnaire in review

## 2022-12-05 DIAGNOSIS — D0512 Intraductal carcinoma in situ of left breast: Secondary | ICD-10-CM | POA: Diagnosis not present

## 2022-12-05 DIAGNOSIS — M85852 Other specified disorders of bone density and structure, left thigh: Secondary | ICD-10-CM | POA: Diagnosis not present

## 2022-12-05 DIAGNOSIS — Z13820 Encounter for screening for osteoporosis: Secondary | ICD-10-CM | POA: Diagnosis not present

## 2022-12-05 DIAGNOSIS — Z09 Encounter for follow-up examination after completed treatment for conditions other than malignant neoplasm: Secondary | ICD-10-CM | POA: Diagnosis not present

## 2022-12-05 DIAGNOSIS — Z780 Asymptomatic menopausal state: Secondary | ICD-10-CM | POA: Diagnosis not present

## 2022-12-05 NOTE — Telephone Encounter (Signed)
Brenda Love, see phone note dates 10/29/22 "triage colonoscopy".   Also received VM from patient asking to schedule a colonoscopy. Per that phone note she told Angelica Chessman she already had one done.

## 2022-12-31 DIAGNOSIS — D0512 Intraductal carcinoma in situ of left breast: Secondary | ICD-10-CM | POA: Diagnosis not present

## 2023-01-07 DIAGNOSIS — D0512 Intraductal carcinoma in situ of left breast: Secondary | ICD-10-CM | POA: Diagnosis not present

## 2023-04-08 DIAGNOSIS — D0512 Intraductal carcinoma in situ of left breast: Secondary | ICD-10-CM | POA: Diagnosis not present

## 2023-04-10 DIAGNOSIS — D0512 Intraductal carcinoma in situ of left breast: Secondary | ICD-10-CM | POA: Diagnosis not present

## 2023-04-23 DIAGNOSIS — E7800 Pure hypercholesterolemia, unspecified: Secondary | ICD-10-CM | POA: Diagnosis not present

## 2023-04-23 DIAGNOSIS — J069 Acute upper respiratory infection, unspecified: Secondary | ICD-10-CM | POA: Diagnosis not present

## 2023-04-23 DIAGNOSIS — I10 Essential (primary) hypertension: Secondary | ICD-10-CM | POA: Diagnosis not present

## 2023-04-23 DIAGNOSIS — Z299 Encounter for prophylactic measures, unspecified: Secondary | ICD-10-CM | POA: Diagnosis not present

## 2023-07-28 DIAGNOSIS — D0512 Intraductal carcinoma in situ of left breast: Secondary | ICD-10-CM | POA: Diagnosis not present

## 2023-07-28 DIAGNOSIS — R921 Mammographic calcification found on diagnostic imaging of breast: Secondary | ICD-10-CM | POA: Diagnosis not present

## 2023-07-28 DIAGNOSIS — Z9889 Other specified postprocedural states: Secondary | ICD-10-CM | POA: Diagnosis not present

## 2023-07-28 DIAGNOSIS — Z86000 Personal history of in-situ neoplasm of breast: Secondary | ICD-10-CM | POA: Diagnosis not present

## 2023-08-01 DIAGNOSIS — R921 Mammographic calcification found on diagnostic imaging of breast: Secondary | ICD-10-CM | POA: Diagnosis not present

## 2023-08-01 DIAGNOSIS — Z1330 Encounter for screening examination for mental health and behavioral disorders, unspecified: Secondary | ICD-10-CM | POA: Diagnosis not present

## 2023-08-15 DIAGNOSIS — N6489 Other specified disorders of breast: Secondary | ICD-10-CM | POA: Diagnosis not present

## 2023-08-15 DIAGNOSIS — Z9012 Acquired absence of left breast and nipple: Secondary | ICD-10-CM | POA: Diagnosis not present

## 2023-08-15 DIAGNOSIS — Z86000 Personal history of in-situ neoplasm of breast: Secondary | ICD-10-CM | POA: Diagnosis not present

## 2023-08-15 DIAGNOSIS — R92322 Mammographic fibroglandular density, left breast: Secondary | ICD-10-CM | POA: Diagnosis not present

## 2023-08-15 DIAGNOSIS — R921 Mammographic calcification found on diagnostic imaging of breast: Secondary | ICD-10-CM | POA: Diagnosis not present

## 2023-08-15 DIAGNOSIS — N641 Fat necrosis of breast: Secondary | ICD-10-CM | POA: Diagnosis not present

## 2023-09-09 DIAGNOSIS — L03313 Cellulitis of chest wall: Secondary | ICD-10-CM | POA: Diagnosis not present

## 2023-09-29 DIAGNOSIS — D0512 Intraductal carcinoma in situ of left breast: Secondary | ICD-10-CM | POA: Diagnosis not present

## 2023-10-01 DIAGNOSIS — R921 Mammographic calcification found on diagnostic imaging of breast: Secondary | ICD-10-CM | POA: Diagnosis not present

## 2023-10-06 DIAGNOSIS — D0512 Intraductal carcinoma in situ of left breast: Secondary | ICD-10-CM | POA: Diagnosis not present

## 2023-11-25 DIAGNOSIS — C50919 Malignant neoplasm of unspecified site of unspecified female breast: Secondary | ICD-10-CM | POA: Diagnosis not present

## 2023-11-25 DIAGNOSIS — Z7689 Persons encountering health services in other specified circumstances: Secondary | ICD-10-CM | POA: Diagnosis not present

## 2023-11-25 DIAGNOSIS — G4483 Primary cough headache: Secondary | ICD-10-CM | POA: Diagnosis not present

## 2023-11-25 DIAGNOSIS — U071 COVID-19: Secondary | ICD-10-CM | POA: Diagnosis not present

## 2023-12-26 DIAGNOSIS — Z79899 Other long term (current) drug therapy: Secondary | ICD-10-CM | POA: Diagnosis not present

## 2023-12-26 DIAGNOSIS — Z23 Encounter for immunization: Secondary | ICD-10-CM | POA: Diagnosis not present

## 2023-12-26 DIAGNOSIS — Z1211 Encounter for screening for malignant neoplasm of colon: Secondary | ICD-10-CM | POA: Diagnosis not present

## 2023-12-26 DIAGNOSIS — I10 Essential (primary) hypertension: Secondary | ICD-10-CM | POA: Diagnosis not present

## 2023-12-26 DIAGNOSIS — C50919 Malignant neoplasm of unspecified site of unspecified female breast: Secondary | ICD-10-CM | POA: Diagnosis not present

## 2023-12-26 DIAGNOSIS — M1990 Unspecified osteoarthritis, unspecified site: Secondary | ICD-10-CM | POA: Diagnosis not present

## 2023-12-26 DIAGNOSIS — E559 Vitamin D deficiency, unspecified: Secondary | ICD-10-CM | POA: Diagnosis not present

## 2023-12-26 DIAGNOSIS — Z13220 Encounter for screening for lipoid disorders: Secondary | ICD-10-CM | POA: Diagnosis not present

## 2023-12-30 ENCOUNTER — Encounter (INDEPENDENT_AMBULATORY_CARE_PROVIDER_SITE_OTHER): Payer: Self-pay | Admitting: *Deleted

## 2024-01-07 ENCOUNTER — Telehealth: Payer: Self-pay

## 2024-01-07 NOTE — Telephone Encounter (Signed)
 Any room Thanks

## 2024-01-07 NOTE — Telephone Encounter (Signed)
 LMOVM to return call.

## 2024-01-07 NOTE — Telephone Encounter (Signed)
 Who is your primary care physician: [facility]  Reasons for the colonoscopy: Hx polyps  Have you had a colonoscopy before?  Yes 04-29-2017  Do you have family history of colon cancer? Yes mother  Previous colonoscopy with polyps removed? yes  Do you have a history colorectal cancer?   no  Are you diabetic? If yes, Type 1 or Type 2?    no  Do you have a prosthetic or mechanical heart valve? no  Do you have a pacemaker/defibrillator?   no  Have you had endocarditis/atrial fibrillation? no  Have you had joint replacement within the last 12 months?  no  Do you tend to be constipated or have to use laxatives? no  Do you have any history of drugs or alchohol?  no  Do you use supplemental oxygen?  no  Have you had a stroke or heart attack within the last 6 months? no  Do you take weight loss medication?  no  For female patients: have you had a hysterectomy?  yes                                     are you post menopausal?       yes                                            do you still have your menstrual cycle? no      Do you take any blood-thinning medications such as: (aspirin, warfarin, Plavix, Aggrenox)  no  If yes we need the name, milligram, dosage and who is prescribing doctor  Current Outpatient Medications on File Prior to Visit  Medication Sig Dispense Refill   anastrozole (ARIMIDEX) 1 MG tablet Take 1 mg by mouth daily.     metoprolol (LOPRESSOR) 50 MG tablet Take 25-50 mg by mouth 2 (two) times daily. Take 1 tablet (50 mg) by mouth in the morning & 0.5 tablet (25 mg) in the evening.     No current facility-administered medications on file prior to visit.    Allergies  Allergen Reactions   Codeine Hives and Nausea And Vomiting   Sulfa Antibiotics Nausea And Vomiting   Sulfasalazine Nausea Only   Sulfacetamide Sodium Nausea And Vomiting     Pharmacy: [facility] [place]  Primary Insurance Name: Medicare [phone]  Best number where you can be  reached: [phone]

## 2024-01-14 ENCOUNTER — Encounter: Payer: Self-pay | Admitting: *Deleted

## 2024-01-14 ENCOUNTER — Other Ambulatory Visit: Payer: Self-pay | Admitting: *Deleted

## 2024-01-14 ENCOUNTER — Encounter (INDEPENDENT_AMBULATORY_CARE_PROVIDER_SITE_OTHER): Payer: Self-pay | Admitting: *Deleted

## 2024-01-14 MED ORDER — PEG 3350-KCL-NA BICARB-NACL 420 G PO SOLR: 4000.0000 mL | Freq: Once | ORAL | 0 refills | Status: AC

## 2024-01-14 NOTE — Telephone Encounter (Signed)
 Referral completed, TCS apt letter sent to PCP

## 2024-01-14 NOTE — Telephone Encounter (Signed)
 Pt has been scheduled for 02/18/24. Instructions mailed and prep sent to pharmacy.

## 2024-01-30 DIAGNOSIS — M79606 Pain in leg, unspecified: Secondary | ICD-10-CM | POA: Diagnosis not present

## 2024-01-30 DIAGNOSIS — E7800 Pure hypercholesterolemia, unspecified: Secondary | ICD-10-CM | POA: Diagnosis not present

## 2024-01-30 DIAGNOSIS — I10 Essential (primary) hypertension: Secondary | ICD-10-CM | POA: Diagnosis not present

## 2024-01-30 DIAGNOSIS — C50919 Malignant neoplasm of unspecified site of unspecified female breast: Secondary | ICD-10-CM | POA: Diagnosis not present

## 2024-01-30 DIAGNOSIS — Z299 Encounter for prophylactic measures, unspecified: Secondary | ICD-10-CM | POA: Diagnosis not present

## 2024-02-09 MED ORDER — PEG 3350-KCL-NA BICARB-NACL 420 G PO SOLR: 4000.0000 mL | Freq: Once | ORAL | 0 refills | Status: AC

## 2024-02-09 NOTE — Addendum Note (Signed)
 Addended by: DALLIE LIONEL RAMAN on: 02/09/2024 01:59 PM   Modules accepted: Orders

## 2024-02-09 NOTE — Telephone Encounter (Signed)
 Patient called stating the pharmacy did not have her prep. Prep has been sent in. Patient is aware.

## 2024-02-17 MED ORDER — PEG 3350-KCL-NA BICARB-NACL 420 G PO SOLR: 4000.0000 mL | Freq: Once | ORAL | 0 refills | Status: AC

## 2024-02-17 NOTE — Telephone Encounter (Signed)
 Patient called stating she drank her prep a day early and needs a prep for tonight. I have sent in another prep to [facility].

## 2024-02-17 NOTE — Addendum Note (Signed)
 Addended by: DALLIE LIONEL RAMAN on: 02/17/2024 09:55 AM   Modules accepted: Orders

## 2024-02-18 ENCOUNTER — Other Ambulatory Visit: Payer: Self-pay

## 2024-02-18 ENCOUNTER — Encounter (HOSPITAL_COMMUNITY): Payer: Self-pay | Admitting: Gastroenterology

## 2024-02-18 ENCOUNTER — Ambulatory Visit (HOSPITAL_COMMUNITY): Admitting: Anesthesiology

## 2024-02-18 ENCOUNTER — Ambulatory Visit (HOSPITAL_COMMUNITY)
Admission: RE | Admit: 2024-02-18 | Discharge: 2024-02-18 | Disposition: A | Discharge status: Home / Self Care | Attending: Gastroenterology | Admitting: Gastroenterology

## 2024-02-18 ENCOUNTER — Encounter (HOSPITAL_COMMUNITY): Admission: RE | Disposition: A | Payer: Self-pay | Source: Home / Self Care | Attending: Gastroenterology

## 2024-02-18 DIAGNOSIS — Z860101 Personal history of adenomatous and serrated colon polyps: Secondary | ICD-10-CM

## 2024-02-18 DIAGNOSIS — Z860100 Personal history of colon polyps, unspecified: Secondary | ICD-10-CM

## 2024-02-18 DIAGNOSIS — Z87891 Personal history of nicotine dependence: Secondary | ICD-10-CM | POA: Diagnosis not present

## 2024-02-18 DIAGNOSIS — I10 Essential (primary) hypertension: Secondary | ICD-10-CM | POA: Diagnosis not present

## 2024-02-18 DIAGNOSIS — K635 Polyp of colon: Secondary | ICD-10-CM | POA: Diagnosis not present

## 2024-02-18 DIAGNOSIS — Z1211 Encounter for screening for malignant neoplasm of colon: Secondary | ICD-10-CM

## 2024-02-18 DIAGNOSIS — K5730 Diverticulosis of large intestine without perforation or abscess without bleeding: Secondary | ICD-10-CM

## 2024-02-18 DIAGNOSIS — K648 Other hemorrhoids: Secondary | ICD-10-CM

## 2024-02-18 HISTORY — PX: COLONOSCOPY: SHX5424

## 2024-02-18 HISTORY — PX: POLYPECTOMY: SHX149

## 2024-02-18 LAB — HM COLONOSCOPY

## 2024-02-18 SURGERY — COLONOSCOPY
Anesthesia: Monitor Anesthesia Care

## 2024-02-18 MED ORDER — PROPOFOL 500 MG/50ML IV EMUL
INTRAVENOUS | Status: DC | PRN
  Administered 2024-02-18: 150 ug/kg/min via INTRAVENOUS
  Administered 2024-02-18: 80 mg via INTRAVENOUS

## 2024-02-18 MED ORDER — LIDOCAINE 2% (20 MG/ML) 5 ML SYRINGE
INTRAMUSCULAR | Status: DC | PRN
  Administered 2024-02-18: 60 mg via INTRAVENOUS

## 2024-02-18 MED ORDER — LACTATED RINGERS IV SOLN: INTRAVENOUS | Status: DC

## 2024-02-18 MED ORDER — LACTATED RINGERS IV SOLN: INTRAVENOUS | Status: DC | PRN

## 2024-02-18 NOTE — Discharge Instructions (Signed)
 You are being discharged to home.  Resume your previous diet.  We are waiting for your pathology results.  Your physician has recommended a repeat colonoscopy in three years for surveillance.

## 2024-02-18 NOTE — Transfer of Care (Signed)
 Immediate Anesthesia Transfer of Care Note  Patient: Brenda Love  Procedure(s) Performed: COLONOSCOPY POLYPECTOMY, INTESTINE  Patient Location: Endoscopy Unit  Anesthesia Type:MAC  Level of Consciousness: awake, alert , oriented, and patient cooperative  Airway & Oxygen Therapy: Patient Spontanous Breathing  Post-op Assessment: Report given to RN, Post -op Vital signs reviewed and stable, and Patient moving all extremities X 4  Post vital signs: Reviewed and stable  Last Vitals:  Vitals Value Taken Time  BP 112/77 02/18/24 09:22  Temp 36.8 C 02/18/24 09:22  Pulse 90 02/18/24 09:22  Resp 20 02/18/24 09:22  SpO2 99 % 02/18/24 09:22    Last Pain:  Vitals:   02/18/24 0922  TempSrc: Oral  PainSc: 0-No pain      Patients Stated Pain Goal: 3 (02/18/24 0809)  Complications: No notable events documented.

## 2024-02-18 NOTE — Op Note (Addendum)
 [facility] Patient Name: Brenda Love Procedure Date: 02/18/2024 8:46 AM MRN: [id number] Date of Birth: [birth year] Attending MD: Toribio Fortune , , [id number] CSN: [id number] Age: 68 Admit Type: Outpatient Procedure:                Colonoscopy Indications:              High risk colon cancer surveillance: Personal                            history of sessile serrated colon polyp (less than                            10 mm in size) with no dysplasia Providers:                Toribio Fortune, Olam Ada, RN, Chad Wilson,                            Technician Referring MD:              Medicines:                Monitored Anesthesia Care Complications:            No immediate complications. Estimated Blood Loss:     Estimated blood loss: none. Procedure:                Pre-Anesthesia Assessment:                           - Prior to the procedure, a History and Physical                            was performed, and patient medications, allergies                            and sensitivities were reviewed. The patient's                            tolerance of previous anesthesia was reviewed.                           - The risks and benefits of the procedure and the                            sedation options and risks were discussed with the                            patient. All questions were answered and informed                            consent was obtained.                           - ASA Grade Assessment: II - A patient with mild                            systemic  disease.                           After obtaining informed consent, the colonoscope                            was passed under direct vision. Throughout the                            procedure, the patient's blood pressure, pulse, and                            oxygen saturations were monitored continuously. The                            [id number]-[id number] ([id number]) Peds Colon was introduced                             through the anus and advanced to the the cecum,                            identified by appendiceal orifice and ileocecal                            valve. The colonoscopy was performed without                            difficulty. The patient tolerated the procedure                            well. The quality of the bowel preparation was good. Scope In: 8:58:03 AM Scope Out: 9:15:45 AM Scope Withdrawal Time: 0 hours 12 minutes 48 seconds  Total Procedure Duration: 0 hours 17 minutes 42 seconds  Findings:      The perianal and digital rectal examinations were normal.      A 10 mm polyp was found in the ascending colon. The polyp was flat. Area       was successfully injected with 3 mL Eleview for a lift polypectomy.       Imaging was performed using white light and narrow band imaging to       visualize the mucosa and demarcate the polyp site after injection for       EMR purposes. The polyp was removed with a cold snare. Resection and       retrieval were complete.      An 8 mm polyp was found in the descending colon. The polyp was       semi-sessile. The polyp was removed with a cold snare. Resection and       retrieval were complete.      A few medium-mouthed diverticula were found in the sigmoid colon.      Non-bleeding internal hemorrhoids were found during retroflexion. The       hemorrhoids were moderate. Impression:               - One 10 mm polyp in the ascending colon, removed  with a cold snare. Resected and retrieved via EMR.                           - One 8 mm polyp in the descending colon, removed                            with a cold snare. Resected and retrieved.                           - Diverticulosis in the sigmoid colon.                           - Non-bleeding internal hemorrhoids. Moderate Sedation:      Per Anesthesia Care Recommendation:           - Discharge patient to home (ambulatory).                           -  Resume previous diet.                           - Await pathology results.                           - Repeat colonoscopy in 3 years for surveillance. Procedure Code(s):        --- Professional ---                           [phone], 59, Colonoscopy, flexible; with removal of                            tumor(s), polyp(s), or other lesion(s) by snare                            technique                           45381, Colonoscopy, flexible; with directed                            submucosal injection(s), any substance Diagnosis Code(s):        --- Professional ---                           Z86.010, Personal history of colonic polyps                           D12.2, Benign neoplasm of ascending colon                           D12.4, Benign neoplasm of descending colon                           K64.8, Other hemorrhoids                           K57.30, Diverticulosis of large intestine  without                            perforation or abscess without bleeding CPT copyright 2022 American Medical Association. All rights reserved. The codes documented in this report are preliminary and upon coder review may  be revised to meet current compliance requirements. Toribio Fortune, MD Toribio Fortune,  02/18/2024 9:22:35 AM This report has been signed electronically. Number of Addenda: 0

## 2024-02-18 NOTE — Anesthesia Preprocedure Evaluation (Signed)
 Anesthesia Evaluation  Patient identified by MRN, date of birth, ID band Patient awake    Reviewed: Allergy & Precautions, H&P , NPO status , Patient's Chart, lab work & pertinent test results, reviewed documented beta blocker date and time   History of Anesthesia Complications (+) PONV and history of anesthetic complications  Airway Mallampati: II  TM Distance: >3 FB Neck ROM: full    Dental no notable dental hx.    Pulmonary neg pulmonary ROS, pneumonia, former smoker   Pulmonary exam normal breath sounds clear to auscultation       Cardiovascular Exercise Tolerance: Good hypertension, negative cardio ROS  Rhythm:regular Rate:Normal     Neuro/Psych negative neurological ROS  negative psych ROS   GI/Hepatic negative GI ROS, Neg liver ROS, hiatal hernia,GERD  ,,  Endo/Other  negative endocrine ROS    Renal/GU negative Renal ROS  negative genitourinary   Musculoskeletal   Abdominal   Peds  Hematology negative hematology ROS (+)   Anesthesia Other Findings   Reproductive/Obstetrics negative OB ROS                              Anesthesia Physical Anesthesia Plan  ASA: 3  Anesthesia Plan: MAC   Post-op Pain Management:    Induction:   PONV Risk Score and Plan: Propofol  infusion  Airway Management Planned:   Additional Equipment:   Intra-op Plan:   Post-operative Plan:   Informed Consent: I have reviewed the patients History and Physical, chart, labs and discussed the procedure including the risks, benefits and alternatives for the proposed anesthesia with the patient or authorized representative who has indicated his/her understanding and acceptance.     Dental Advisory Given  Plan Discussed with: CRNA  Anesthesia Plan Comments:         Anesthesia Quick Evaluation

## 2024-02-18 NOTE — H&P (Signed)
 Brenda Love is an 68 y.o. female.   Chief Complaint: History of colon polyps HPI: 68 year old female with past medical history of GERD, hiatal hernia, hyperlipidemia, hypertension, who came for follow-up of colon polyps.   Last colonoscopy in 2019, had 1 sessile serrated polyp.  The patient denies having any complaints such as melena, hematochezia, abdominal pain or distention, change in her bowel movement consistency or frequency, no changes in weight recently.  No family history of colorectal cancer.  Past Medical History:  Diagnosis Date   Colon polyps    Dr. Jeryl   Complication of anesthesia    woke up during procedure   GERD (gastroesophageal reflux disease)    H/O hiatal hernia    Hard of hearing    HOH (hard of hearing)    hearing aids   Hypercholesteremia    Hypertension    Myofascial pain on right side 05/10/2014   Obesity    Pneumonia    PONV (postoperative nausea and vomiting)    S/P colonoscopy 10/01/2003   Dr Ali   TMJ syndrome    Tremor 05/10/2014    Past Surgical History:  Procedure Laterality Date   ARTHROTOMY Right 12/28/2012   Procedure: Temporomandibular Joint Arthrotomy; Meniscectomy;  Surgeon: Glendia CHRISTELLA Primrose, DDS;  Location: [facility] OR;  Service: Oral Surgery;  Laterality: Right;   ARTHROTOMY Right 07/09/2013   Procedure: ARTHROTOMY CONDYLECTOMY,TMJ disc fragment removal  AND TEMPORALIS GRAFT;  Surgeon: Glendia CHRISTELLA Primrose, DDS;  Location: [facility] OR;  Service: Oral Surgery;  Laterality: Right;   BIOPSY  04/29/2017   Procedure: BIOPSY;  Surgeon: Harvey Margo CROME, MD;  Location: [facility];  Service: Endoscopy;;  colon   BREAST BIOPSY Left 05/29/2022   MM LT BREAST BX W LOC DEV EA AD LESION IMG BX SPEC STEREO GUIDE 05/29/2022 [facility]   BREAST BIOPSY Left 05/29/2022   MM LT BREAST BX W LOC DEV 1ST LESION IMAGE BX SPEC STEREO GUIDE 05/29/2022 [facility]   BREAST SURGERY     Hx: of left lumpectomy   CHOLECYSTECTOMY     COLONOSCOPY N/A 04/29/2017    Procedure: COLONOSCOPY;  Surgeon: Harvey Margo CROME, MD;  Location: [facility];  Service: Endoscopy;  Laterality: N/A;  3:00   ESOPHAGOGASTRODUODENOSCOPY  03/13/2011   Moderate gastritis in the antrum/SMALL Hiatal hernia   ILEOColonoscopy  03/13/2011   MILD Diverticulosis in the sigmoid to descending colon/ Internal hemorrhoids/POLYPOID LESIONS IN COLON IC VALVE LESION MOST LIKELY A HEALING ULCER FROM NSAID INJURY HEME POS STOOLS 2o TO HEMORRHOIDS, NSAID COLOPATHY/GASTRITIS   POLYPECTOMY  04/29/2017   Procedure: POLYPECTOMY;  Surgeon: Harvey Margo CROME, MD;  Location: [facility];  Service: Endoscopy;;  colon   TEMPOROMANDIBULAR JOINT SURGERY     TONSILLECTOMY     VAGINAL HYSTERECTOMY     VAGINAL PROLAPSE REPAIR      Family History  Problem Relation Age of Onset   Colon cancer Mother 5   Hypertension Mother    Diabetes type I Mother    Cancer Mother    Diabetes Sister    Coronary artery disease Father    Emphysema Father    Colon polyps Neg Hx    Social History:  reports that she quit smoking about 29 years ago. Her smoking use included cigarettes. She started smoking about 34 years ago. She has a 5 pack-year smoking history. She has never used smokeless tobacco. She reports that she does not drink alcohol  and does not use drugs.  Allergies:  Allergies  Allergen Reactions   Codeine Hives and Nausea And Vomiting   Sulfa Antibiotics Nausea And Vomiting   Sulfasalazine Nausea Only   Sulfacetamide Sodium Nausea And Vomiting    Medications Prior to Admission  Medication Sig Dispense Refill   anastrozole (ARIMIDEX) 1 MG tablet Take 1 mg by mouth daily.     metoprolol (LOPRESSOR) 50 MG tablet Take 25-50 mg by mouth 2 (two) times daily. Take 1 tablet (50 mg) by mouth in the morning & 0.5 tablet (25 mg) in the evening.      No results found for this or any previous visit (from the past 48 hours). No results found.  Review of Systems  All other systems reviewed and are  negative.   Blood pressure (!) 146/87, pulse (!) 101, temperature 98.1 F (36.7 C), temperature source Oral, resp. rate (!) 22, height 5' 3 (1.6 m), weight 86.2 kg, SpO2 99%. Physical Exam  GENERAL: The patient is AO x3, in no acute distress. HEENT: Head is normocephalic and atraumatic. EOMI are intact. Mouth is well hydrated and without lesions. NECK: Supple. No masses LUNGS: Clear to auscultation. No presence of rhonchi/wheezing/rales. Adequate chest expansion HEART: RRR, normal s1 and s2. ABDOMEN: Soft, nontender, no guarding, no peritoneal signs, and nondistended. BS +. No masses. EXTREMITIES: Without any cyanosis, clubbing, rash, lesions or edema. NEUROLOGIC: AOx3, no focal motor deficit. SKIN: no jaundice, no rashes  Assessment/Plan 68 year old female with past medical history of GERD, hiatal hernia, hyperlipidemia, hypertension, who came for follow-up of colon polyps.  Will proceed with colonoscopy.  Toribio Eartha Flavors, MD 02/18/2024, 8:18 AM

## 2024-02-19 ENCOUNTER — Encounter (INDEPENDENT_AMBULATORY_CARE_PROVIDER_SITE_OTHER): Payer: Self-pay | Admitting: *Deleted

## 2024-02-19 ENCOUNTER — Encounter (HOSPITAL_COMMUNITY): Payer: Self-pay | Admitting: Gastroenterology

## 2024-02-19 ENCOUNTER — Ambulatory Visit (INDEPENDENT_AMBULATORY_CARE_PROVIDER_SITE_OTHER): Payer: Self-pay | Admitting: Gastroenterology

## 2024-02-19 LAB — SURGICAL PATHOLOGY

## 2024-02-19 NOTE — Progress Notes (Signed)
 3 yr TCS noted in recall Patient result letter mailed procedure note and pathology result faxed to PCP

## 2024-02-22 NOTE — Anesthesia Postprocedure Evaluation (Signed)
 Anesthesia Post Note  Patient: Brenda Love  Procedure(s) Performed: COLONOSCOPY POLYPECTOMY, INTESTINE  Patient location during evaluation: Phase II Anesthesia Type: MAC Level of consciousness: awake Pain management: pain level controlled Vital Signs Assessment: post-procedure vital signs reviewed and stable Respiratory status: spontaneous breathing and respiratory function stable Cardiovascular status: blood pressure returned to baseline and stable Postop Assessment: no headache and no apparent nausea or vomiting Anesthetic complications: no Comments: Late entry   No notable events documented.   Last Vitals:  Vitals:   02/18/24 0809 02/18/24 0922  BP: (!) 146/87 112/77  Pulse: (!) 101 90  Resp: (!) 22 20  Temp: 36.7 C 36.8 C  SpO2: 99% 99%    Last Pain:  Vitals:   02/18/24 0922  TempSrc: Oral  PainSc: 0-No pain                 Yvonna JINNY Bosworth

## 2024-03-17 ENCOUNTER — Telehealth (INDEPENDENT_AMBULATORY_CARE_PROVIDER_SITE_OTHER): Payer: Self-pay

## 2024-03-17 NOTE — Telephone Encounter (Signed)
 I spoke with patient when she called into office today at 3:40pm. WE had a weak signal, I could not make out everything she was saying. I get catch that she has a hurting ear, swollen and a referral to see us . Patient stated she cannot hear, she read the words on her phone. I did see the referral and asked the patient to hold on.Patient stated that she cannot hear, she reads words on her phone and that she would hold on. I went to our referral team and got the # to transfer the call to them and told them what was going on and gave MRN # to view referral. When I got to my phone to transfer, patient had hung up or lost signal.

## 2024-03-30 ENCOUNTER — Institutional Professional Consult (permissible substitution) (INDEPENDENT_AMBULATORY_CARE_PROVIDER_SITE_OTHER): Admitting: Physician Assistant

## 2024-04-15 ENCOUNTER — Institutional Professional Consult (permissible substitution) (INDEPENDENT_AMBULATORY_CARE_PROVIDER_SITE_OTHER): Admitting: Physician Assistant
